# Patient Record
Sex: Female | Born: 1965
Health system: Southern US, Community
[De-identification: ages and names within clinical notes are randomized; demographics above are authoritative.]

## PROBLEM LIST (undated history)

## (undated) ENCOUNTER — Ambulatory Visit

## (undated) DIAGNOSIS — J45909 Unspecified asthma, uncomplicated: Secondary | ICD-10-CM

## (undated) DIAGNOSIS — G43909 Migraine, unspecified, not intractable, without status migrainosus: Secondary | ICD-10-CM

## (undated) HISTORY — PX: ADENOIDECTOMY: SUR15

## (undated) HISTORY — PX: DILATION AND CURETTAGE OF UTERUS: SHX78

## (undated) HISTORY — PX: TONSILLECTOMY: SUR1361

## (undated) HISTORY — DX: Unspecified asthma, uncomplicated: J45.909

---

## 1999-06-19 ENCOUNTER — Other Ambulatory Visit: Admission: RE | Admit: 1999-06-19 | Discharge: 1999-06-19 | Payer: Self-pay | Admitting: Obstetrics and Gynecology

## 1999-11-04 ENCOUNTER — Inpatient Hospital Stay (HOSPITAL_COMMUNITY): Admission: AD | Admit: 1999-11-04 | Discharge: 1999-11-04 | Payer: Self-pay | Admitting: Obstetrics & Gynecology

## 1999-11-05 ENCOUNTER — Ambulatory Visit (HOSPITAL_COMMUNITY): Admission: RE | Admit: 1999-11-05 | Discharge: 1999-11-05 | Payer: Self-pay | Admitting: Obstetrics and Gynecology

## 1999-11-08 ENCOUNTER — Encounter: Payer: Self-pay | Admitting: Obstetrics and Gynecology

## 1999-11-08 ENCOUNTER — Inpatient Hospital Stay (HOSPITAL_COMMUNITY): Admission: AD | Admit: 1999-11-08 | Discharge: 1999-11-08 | Payer: Self-pay | Admitting: Obstetrics and Gynecology

## 1999-11-10 ENCOUNTER — Inpatient Hospital Stay (HOSPITAL_COMMUNITY): Admission: AD | Admit: 1999-11-10 | Discharge: 1999-11-10 | Payer: Self-pay | Admitting: Obstetrics and Gynecology

## 1999-12-07 ENCOUNTER — Inpatient Hospital Stay (HOSPITAL_COMMUNITY): Admission: AD | Admit: 1999-12-07 | Discharge: 1999-12-07 | Payer: Self-pay | Admitting: Obstetrics and Gynecology

## 2000-01-07 ENCOUNTER — Inpatient Hospital Stay (HOSPITAL_COMMUNITY): Admission: AD | Admit: 2000-01-07 | Discharge: 2000-01-09 | Payer: Self-pay | Admitting: Obstetrics and Gynecology

## 2000-02-11 ENCOUNTER — Other Ambulatory Visit: Admission: RE | Admit: 2000-02-11 | Discharge: 2000-02-11 | Payer: Self-pay | Admitting: Obstetrics and Gynecology

## 2001-05-11 ENCOUNTER — Other Ambulatory Visit: Admission: RE | Admit: 2001-05-11 | Discharge: 2001-05-11 | Payer: Self-pay | Admitting: Obstetrics and Gynecology

## 2002-08-17 ENCOUNTER — Other Ambulatory Visit: Admission: RE | Admit: 2002-08-17 | Discharge: 2002-08-17 | Payer: Self-pay | Admitting: Obstetrics and Gynecology

## 2003-08-22 ENCOUNTER — Other Ambulatory Visit: Admission: RE | Admit: 2003-08-22 | Discharge: 2003-08-22 | Payer: Self-pay | Admitting: Obstetrics and Gynecology

## 2003-11-08 ENCOUNTER — Ambulatory Visit (HOSPITAL_COMMUNITY): Admission: RE | Admit: 2003-11-08 | Discharge: 2003-11-08 | Payer: Self-pay | Admitting: Obstetrics and Gynecology

## 2003-11-08 ENCOUNTER — Encounter (INDEPENDENT_AMBULATORY_CARE_PROVIDER_SITE_OTHER): Payer: Self-pay | Admitting: Specialist

## 2004-05-28 ENCOUNTER — Encounter: Admission: RE | Admit: 2004-05-28 | Discharge: 2004-07-10 | Payer: Self-pay | Admitting: Sports Medicine

## 2004-06-05 ENCOUNTER — Encounter: Admission: RE | Admit: 2004-06-05 | Discharge: 2004-06-05 | Payer: Self-pay | Admitting: Family Medicine

## 2004-09-03 ENCOUNTER — Other Ambulatory Visit: Admission: RE | Admit: 2004-09-03 | Discharge: 2004-09-03 | Payer: Self-pay | Admitting: Obstetrics and Gynecology

## 2005-09-10 ENCOUNTER — Other Ambulatory Visit: Admission: RE | Admit: 2005-09-10 | Discharge: 2005-09-10 | Payer: Self-pay | Admitting: Obstetrics and Gynecology

## 2006-09-15 ENCOUNTER — Other Ambulatory Visit: Admission: RE | Admit: 2006-09-15 | Discharge: 2006-09-15 | Payer: Self-pay | Admitting: Obstetrics and Gynecology

## 2006-09-28 ENCOUNTER — Encounter: Admission: RE | Admit: 2006-09-28 | Discharge: 2006-09-28 | Payer: Self-pay | Admitting: Obstetrics and Gynecology

## 2007-11-18 ENCOUNTER — Other Ambulatory Visit: Admission: RE | Admit: 2007-11-18 | Discharge: 2007-11-18 | Payer: Self-pay | Admitting: Obstetrics and Gynecology

## 2008-11-22 ENCOUNTER — Other Ambulatory Visit: Admission: RE | Admit: 2008-11-22 | Discharge: 2008-11-22 | Payer: Self-pay | Admitting: Obstetrics and Gynecology

## 2009-10-24 ENCOUNTER — Encounter: Admission: RE | Admit: 2009-10-24 | Discharge: 2009-10-24 | Payer: Self-pay | Admitting: Obstetrics and Gynecology

## 2009-12-14 ENCOUNTER — Other Ambulatory Visit: Admission: RE | Admit: 2009-12-14 | Discharge: 2009-12-14 | Payer: Self-pay | Admitting: Obstetrics and Gynecology

## 2010-02-21 ENCOUNTER — Ambulatory Visit (HOSPITAL_COMMUNITY): Admission: RE | Admit: 2010-02-21 | Discharge: 2010-02-21 | Payer: Self-pay | Admitting: Sports Medicine

## 2010-03-21 ENCOUNTER — Encounter: Admission: RE | Admit: 2010-03-21 | Discharge: 2010-05-30 | Payer: Self-pay | Admitting: Sports Medicine

## 2010-11-01 ENCOUNTER — Encounter
Admission: RE | Admit: 2010-11-01 | Discharge: 2010-11-01 | Payer: Self-pay | Source: Home / Self Care | Attending: Obstetrics and Gynecology | Admitting: Obstetrics and Gynecology

## 2010-12-20 ENCOUNTER — Other Ambulatory Visit (HOSPITAL_COMMUNITY)
Admission: RE | Admit: 2010-12-20 | Discharge: 2010-12-20 | Disposition: A | Discharge status: Home / Self Care | Payer: 59 | Source: Ambulatory Visit | Attending: Obstetrics and Gynecology | Admitting: Obstetrics and Gynecology

## 2010-12-20 ENCOUNTER — Other Ambulatory Visit: Payer: Self-pay | Admitting: Obstetrics and Gynecology

## 2010-12-20 DIAGNOSIS — Z01419 Encounter for gynecological examination (general) (routine) without abnormal findings: Secondary | ICD-10-CM | POA: Insufficient documentation

## 2011-03-14 NOTE — Op Note (Signed)
NAME:  Regina Rice, Regina Rice                          ACCOUNT NO.:  [id number]   MEDICAL RECORD NO.:  [id number]                   PATIENT TYPE:  AMB   LOCATION:  SDC                                  FACILITY:  [facility]   PHYSICIAN:  Artist Pais, M.D.                 DATE OF BIRTH:  1966-02-09   DATE OF PROCEDURE:  11/08/2003  DATE OF DISCHARGE:                                 OPERATIVE REPORT   PREOPERATIVE DIAGNOSES:  Endometrial polyp found on sonohysterogram.  Sonohysterogram performed due to thick endometrium on pelvic ultrasound.  The patient had the ultrasound for her menorrhagia.   POSTOPERATIVE DIAGNOSES:  1. Endometrial polyp found on sonohysterogram.  2. Menorrhagia.   SURGEON:  Artist Pais, MD, Ph.D.   PROCEDURE:  1. Dilatation and curettage.  2. Hysteroscopy.  3. Polypectomy.   ANESTHESIA:  Monitored anesthesia care plus 1% lidocaine, 20 mL paracervical  block.   ESTIMATED BLOOD LOSS:  Minimal.   FLUIDS:  1200 mL of crystalloid.   COMPLICATIONS:  None.   DRAINS:  None.   FINDINGS:  Thickened endometrium despite the patient being on Yasmin, which  she just started.  Three polyps were noted.  After curettage and  polypectomy, the scope was re-placed, and the polyps were noted to be  removed in their entirety.   DESCRIPTION OF OPERATION:  The patient was brought to the operating room,  identified on the operating room table.  After induction of adequate MAC  analgesia, the patient was placed in the dorsal lithotomy position and  prepped and draped in the usual sterile fashion.  The bladder was straight  catheterized for approximately 125 mL of clear yellow urine.  Examination  under anesthesia revealed the uterus to be anteverted, mobile, approximately  six weeks size.  A speculum was placed after sterile prep and drape, and the  anterior lip of the cervix was infiltrated with 1 mL of 1% lidocaine and  grasped with a single-tooth tenaculum.  The remaining 19 mL of  1% lidocaine  were placed for a paracervical block.  The cervix was then very gently  dilated up to a #25 Pratt dilator.  The uterus sounded to approximately 7  cm.  Dilatation proceeded very carefully and gently to decrease the risk of  uterine perforation.  Using the ACMI hysteroscope and sorbitol as the  distending medium, a careful and thorough hysteroscopic examination was  performed.  There were noted to be two large endometrial polyps and a  smaller endometrial polyp was noted behind one of the large endometrial  polyps.  The endometrium was noted to be extremely thickened and because of  the polyps and the thick endometrium, I was unable to identify the tubal  ostia.  Subsequently the scope was withdrawn, and a careful curettage was  performed with copious tissue and polyp obtained.  After a good cry was  heard all around, the [employer]  Stone forceps were placed, and additional  tissue was obtained in the endometrium.  Subsequently, the ACMI hysteroscope  was again placed, and a careful hysteroscopic examination revealed the  polyps to have been removed in their entirety.  In addition, the endometrium  was noted to be curetted completely.  At that point, the procedure was then  terminated.  The patient tolerated the procedure well without apparent  complication and was transferred to the recovery room in stable condition  after instrument, sponge, and needle counts were correct.  She was noted to  have minimal bleeding from the surgery but a small amount of bleeding from  the right-most tenaculum site, and excellent hemostasis was achieved using  Monsel solution.  She did receive the postop D&C instruction sheet, is urged  to call if she is soaking a large pad an hour for three straight hours or  for any problems.  She may take ibuprofen for pain.  She will return in two  weeks for postoperative examination and call with any problem.  She is to  refrain from intercourse as indicated  on the sheet for two weeks.                                               Artist Pais, M.D.    DC/MEDQ  D:  11/08/2003  T:  11/08/2003  Job:  [id number]

## 2011-03-14 NOTE — H&P (Signed)
NAME:  Regina Rice, Regina Rice                          ACCOUNT NO.:  [id number]   MEDICAL RECORD NO.:  [id number]                   PATIENT TYPE:  AMB   LOCATION:  SDC                                  [facility]ILITY:  [facility]   PHYSICIAN:  Artist Pais, M.D.                 DATE OF BIRTH:  October 25, 1966   DATE OF ADMISSION:  11/08/2003  DATE OF DISCHARGE:                                HISTORY & PHYSICAL   PREOPERATIVE HISTORY AND PHYSICAL:   HISTORY OF PRESENT ILLNESS:  The patient is a 45 year old Caucasian female  para 2 who was seen for her annual examination on August 22, 2003.  At that  time I was assessing how she was doing on her Yasmin oral contraceptive and  she is having predictable bleeding but was found to be using a Super Plus  Tampon and changing it every 2-3 hours on the second day.  She had  previously been placed on Yasmin to control her menorrhagia.  She  subsequently returned for a followup visit on September 06, 2003 after she  had undergone a pelvic ultrasound and this was for menorrhagia and a history  of fibroids.  Pelvic ultrasound revealed her endometrium to be thick at 8.9  mm with echogenic defects measuring 8 x 6 and 8 x 4 mm, thus the decision  was made for her to undergo a sonohistogram at the end of her menstrual  period.  She subsequently underwent a sonohistogram on September 29, 2003 at  which time she was found to have two posterior wall defects 8 x 6 mm and 7 x  6 mm consistent with polyps.  This may well be the etiology of her  menorrhagia.  She was advised to undergo a dilation and curettage  hysteroscopy and polypectomy.  Risks of surgery including anesthetic  complications, hemorrhage, infection, damage to adjacent structures  including bladder, bowel, blood vessels or ureter were discussed with the  patient.  She was made aware of the risks of uterine perforation which could  result in overwhelming life-threatening hemorrhage requiring emergent  hysterectomy or  uterine perforation which could result in bowel damage or  which could result in overwhelming life-threatening peritonitis.  If she  were to have bowel damage this might require a bowel resection or colostomy.  She expressed understanding of and acceptance of these risks and desires to  proceed with surgery.   PAST MEDICAL HISTORY:  1. Asthma.  2. Headaches.   ALLERGIES:  AMOXICILLIN and PENICILLIN.   CURRENT MEDICATIONS:  Yasmin.   SURGERIES:  1. T&A in 1985.  2. Spontaneous vaginal delivery x2 - one in 1997, one in March 2001.   FAMILY HISTORY:  There is no family history of colon, breast, or ovarian  cancer, a distant relative does have prostate cancer.  The patient's mother  is 78 with hypertension, arthritis, osteoporosis, and lupus.  Her father is  49  with heart disease and diabetes.  She has one brother age 33 alive and  well, two children are alive and well.   SOCIAL HISTORY:  Patient is a homemaker, she does not smoke and drinks  alcohol only occasionally.   REVIEW OF SYSTEMS:  Noncontributory except as noted above.  Denies headache,  visual changes, chest pain, shortness of breath, abdominal pain, change in  bowel habits, unintentional weight loss, dysuria, urgency, frequency,  vaginal pruritus or discharge, pain or bleeding with intercourse.   PHYSICAL EXAMINATION:  GENERAL APPEARANCE:  Well-developed Caucasian female.  VITAL SIGNS:  Blood pressure 116/80, heart rate 76, weight 226, height 5  feet 11-1/2 inches.  HEENT:  Normal.  NECK:  Supple without thyromegaly, adenopathy, or nodules.  CHEST:  Clear to auscultation.  BREASTS:  Symmetrical without masses, no nipple retraction or nipple  discharge.  CARDIAC:  Regular rate and rhythm without extra sounds or murmurs.  ABDOMEN:  Soft, nontender, no hepatosplenomegaly or masses.  PELVIC:  Normal external female genitalia, no vulvar, vaginal, or cervical  lesions.  Pap smear performed August 22, 2003 was within  normal limits.  Bimanual examination reveals the uterus to be normal, retroverted without  any adnexal mass palpated.  RECTAL:  Excellent sphincter tone, confirms pelvic exam, no masses palpated.   LABORATORY STUDIES:  Sodium is 138, potassium 4, chloride 103, CO2 27, BUN  is 11, creatinine 0.7 and glucose is elevated at 115 and we will check this  fasting.  Pregnancy test is negative.  Hemoglobin is 13.2 and platelet count  is 296,000.  Urinalysis is negative.   ASSESSMENT AND PLAN:  The patient is a 45 year old Caucasian female para 2  with menorrhagia and polyps found on sonohistogram admitted for dilation and  curettage hysteroscopy and polypectomy.  Risks of surgery have been  discussed with the patient, she expresses understanding of and acceptance of  those risks and desires to proceed with surgery.  All her questions were  answered.                                               Artist Pais, M.D.    DC/MEDQ  D:  11/07/2003  T:  11/07/2003  Job:  [id number]   cc:   [facility] at [facility]

## 2011-10-17 ENCOUNTER — Other Ambulatory Visit: Payer: Self-pay | Admitting: Obstetrics and Gynecology

## 2011-10-17 DIAGNOSIS — Z1231 Encounter for screening mammogram for malignant neoplasm of breast: Secondary | ICD-10-CM

## 2011-11-14 ENCOUNTER — Ambulatory Visit: Payer: 59

## 2011-12-26 ENCOUNTER — Ambulatory Visit
Admission: RE | Admit: 2011-12-26 | Discharge: 2011-12-26 | Disposition: A | Discharge status: Home / Self Care | Payer: 59 | Source: Ambulatory Visit | Attending: Obstetrics and Gynecology | Admitting: Obstetrics and Gynecology

## 2011-12-26 DIAGNOSIS — Z1231 Encounter for screening mammogram for malignant neoplasm of breast: Secondary | ICD-10-CM

## 2012-01-09 ENCOUNTER — Other Ambulatory Visit (HOSPITAL_COMMUNITY)
Admission: RE | Admit: 2012-01-09 | Discharge: 2012-01-09 | Disposition: A | Discharge status: Home / Self Care | Payer: 59 | Source: Ambulatory Visit | Attending: Obstetrics and Gynecology | Admitting: Obstetrics and Gynecology

## 2012-01-09 ENCOUNTER — Other Ambulatory Visit: Payer: Self-pay | Admitting: Obstetrics and Gynecology

## 2012-01-09 DIAGNOSIS — Z01419 Encounter for gynecological examination (general) (routine) without abnormal findings: Secondary | ICD-10-CM | POA: Insufficient documentation

## 2012-05-04 ENCOUNTER — Other Ambulatory Visit (HOSPITAL_COMMUNITY): Payer: Self-pay | Admitting: Family Medicine

## 2012-05-04 DIAGNOSIS — J329 Chronic sinusitis, unspecified: Secondary | ICD-10-CM

## 2012-05-05 ENCOUNTER — Ambulatory Visit (HOSPITAL_COMMUNITY)
Admission: RE | Admit: 2012-05-05 | Discharge: 2012-05-05 | Disposition: A | Discharge status: Home / Self Care | Payer: 59 | Source: Ambulatory Visit | Attending: Family Medicine | Admitting: Family Medicine

## 2012-05-05 DIAGNOSIS — J320 Chronic maxillary sinusitis: Secondary | ICD-10-CM | POA: Insufficient documentation

## 2012-05-05 DIAGNOSIS — J321 Chronic frontal sinusitis: Secondary | ICD-10-CM | POA: Insufficient documentation

## 2012-05-05 DIAGNOSIS — J329 Chronic sinusitis, unspecified: Secondary | ICD-10-CM

## 2012-05-05 DIAGNOSIS — J322 Chronic ethmoidal sinusitis: Secondary | ICD-10-CM | POA: Insufficient documentation

## 2012-05-05 DIAGNOSIS — R51 Headache: Secondary | ICD-10-CM | POA: Insufficient documentation

## 2012-09-01 ENCOUNTER — Emergency Department (INDEPENDENT_AMBULATORY_CARE_PROVIDER_SITE_OTHER)
Admission: EM | Admit: 2012-09-01 | Discharge: 2012-09-01 | Disposition: A | Discharge status: Home / Self Care | Payer: Self-pay | Source: Home / Self Care | Attending: Emergency Medicine | Admitting: Emergency Medicine

## 2012-09-01 ENCOUNTER — Encounter (HOSPITAL_COMMUNITY): Payer: Self-pay | Admitting: *Deleted

## 2012-09-01 ENCOUNTER — Emergency Department (INDEPENDENT_AMBULATORY_CARE_PROVIDER_SITE_OTHER): Payer: 59

## 2012-09-01 DIAGNOSIS — S139XXA Sprain of joints and ligaments of unspecified parts of neck, initial encounter: Secondary | ICD-10-CM

## 2012-09-01 DIAGNOSIS — S161XXA Strain of muscle, fascia and tendon at neck level, initial encounter: Secondary | ICD-10-CM

## 2012-09-01 DIAGNOSIS — S20219A Contusion of unspecified front wall of thorax, initial encounter: Secondary | ICD-10-CM

## 2012-09-01 HISTORY — DX: Migraine, unspecified, not intractable, without status migrainosus: G43.909

## 2012-09-01 MED ORDER — METHOCARBAMOL 500 MG PO TABS: 500.0000 mg | ORAL_TABLET | Freq: Three times a day (TID) | ORAL | Status: DC

## 2012-09-01 MED ORDER — TRAMADOL HCL 50 MG PO TABS: 100.0000 mg | ORAL_TABLET | Freq: Three times a day (TID) | ORAL | Status: DC | PRN
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
## 2012-12-11 ENCOUNTER — Other Ambulatory Visit: Payer: Self-pay

## 2012-12-24 ENCOUNTER — Other Ambulatory Visit: Payer: Self-pay

## 2012-12-24 DIAGNOSIS — Z1231 Encounter for screening mammogram for malignant neoplasm of breast: Secondary | ICD-10-CM

## 2013-01-13 ENCOUNTER — Other Ambulatory Visit: Payer: Self-pay | Admitting: Obstetrics and Gynecology

## 2013-01-13 ENCOUNTER — Other Ambulatory Visit (HOSPITAL_COMMUNITY)
Admission: RE | Admit: 2013-01-13 | Discharge: 2013-01-13 | Disposition: A | Discharge status: Home / Self Care | Payer: 59 | Source: Ambulatory Visit | Attending: Obstetrics and Gynecology | Admitting: Obstetrics and Gynecology

## 2013-01-13 DIAGNOSIS — Z01419 Encounter for gynecological examination (general) (routine) without abnormal findings: Secondary | ICD-10-CM | POA: Insufficient documentation

## 2013-01-13 DIAGNOSIS — Z1151 Encounter for screening for human papillomavirus (HPV): Secondary | ICD-10-CM | POA: Insufficient documentation

## 2013-01-27 ENCOUNTER — Other Ambulatory Visit: Payer: Self-pay | Admitting: Obstetrics and Gynecology

## 2013-01-28 ENCOUNTER — Ambulatory Visit
Admission: RE | Admit: 2013-01-28 | Discharge: 2013-01-28 | Disposition: A | Discharge status: Home / Self Care | Payer: 59 | Source: Ambulatory Visit

## 2013-01-28 DIAGNOSIS — Z1231 Encounter for screening mammogram for malignant neoplasm of breast: Secondary | ICD-10-CM

## 2013-01-31 ENCOUNTER — Other Ambulatory Visit: Payer: Self-pay | Admitting: Obstetrics and Gynecology

## 2013-01-31 DIAGNOSIS — R928 Other abnormal and inconclusive findings on diagnostic imaging of breast: Secondary | ICD-10-CM

## 2013-02-09 ENCOUNTER — Ambulatory Visit
Admission: RE | Admit: 2013-02-09 | Discharge: 2013-02-09 | Disposition: A | Discharge status: Home / Self Care | Payer: 59 | Source: Ambulatory Visit | Attending: Obstetrics and Gynecology | Admitting: Obstetrics and Gynecology

## 2013-02-09 DIAGNOSIS — R928 Other abnormal and inconclusive findings on diagnostic imaging of breast: Secondary | ICD-10-CM

## 2013-09-01 ENCOUNTER — Other Ambulatory Visit: Payer: Self-pay

## 2014-02-03 ENCOUNTER — Other Ambulatory Visit (HOSPITAL_COMMUNITY)
Admission: RE | Admit: 2014-02-03 | Discharge: 2014-02-03 | Disposition: A | Discharge status: Home / Self Care | Payer: 59 | Source: Ambulatory Visit | Attending: Obstetrics and Gynecology | Admitting: Obstetrics and Gynecology

## 2014-02-03 ENCOUNTER — Other Ambulatory Visit: Payer: Self-pay | Admitting: Obstetrics and Gynecology

## 2014-02-03 DIAGNOSIS — Z01419 Encounter for gynecological examination (general) (routine) without abnormal findings: Secondary | ICD-10-CM | POA: Insufficient documentation

## 2014-02-20 ENCOUNTER — Other Ambulatory Visit: Payer: Self-pay | Admitting: Obstetrics and Gynecology

## 2014-03-24 ENCOUNTER — Other Ambulatory Visit (HOSPITAL_COMMUNITY): Payer: Self-pay | Admitting: Family Medicine

## 2014-03-24 DIAGNOSIS — R110 Nausea: Secondary | ICD-10-CM

## 2014-03-24 DIAGNOSIS — R1110 Vomiting, unspecified: Secondary | ICD-10-CM

## 2014-03-29 ENCOUNTER — Ambulatory Visit (HOSPITAL_COMMUNITY)
Admission: RE | Admit: 2014-03-29 | Discharge: 2014-03-29 | Disposition: A | Discharge status: Home / Self Care | Payer: 59 | Source: Ambulatory Visit | Attending: Family Medicine | Admitting: Family Medicine

## 2014-03-29 DIAGNOSIS — R1110 Vomiting, unspecified: Secondary | ICD-10-CM

## 2014-03-29 DIAGNOSIS — R110 Nausea: Secondary | ICD-10-CM

## 2014-03-29 DIAGNOSIS — R112 Nausea with vomiting, unspecified: Secondary | ICD-10-CM | POA: Insufficient documentation

## 2014-03-29 DIAGNOSIS — K8020 Calculus of gallbladder without cholecystitis without obstruction: Secondary | ICD-10-CM | POA: Insufficient documentation

## 2014-05-23 ENCOUNTER — Other Ambulatory Visit: Payer: Self-pay | Admitting: Obstetrics and Gynecology

## 2014-06-07 ENCOUNTER — Encounter (HOSPITAL_COMMUNITY): Admission: RE | Payer: Self-pay | Source: Ambulatory Visit

## 2014-06-07 ENCOUNTER — Ambulatory Visit (HOSPITAL_COMMUNITY): Admission: RE | Admit: 2014-06-07 | Payer: 59 | Source: Ambulatory Visit | Admitting: Obstetrics and Gynecology

## 2014-06-07 SURGERY — HYSTERECTOMY, VAGINAL, LAPAROSCOPY-ASSISTED
Anesthesia: Choice

## 2014-12-12 ENCOUNTER — Other Ambulatory Visit: Payer: Self-pay

## 2014-12-12 DIAGNOSIS — Z1231 Encounter for screening mammogram for malignant neoplasm of breast: Secondary | ICD-10-CM

## 2014-12-22 ENCOUNTER — Ambulatory Visit
Admission: RE | Admit: 2014-12-22 | Discharge: 2014-12-22 | Disposition: A | Discharge status: Home / Self Care | Payer: 59 | Source: Ambulatory Visit

## 2014-12-22 ENCOUNTER — Other Ambulatory Visit: Payer: Self-pay

## 2014-12-22 DIAGNOSIS — Z1231 Encounter for screening mammogram for malignant neoplasm of breast: Secondary | ICD-10-CM

## 2015-03-23 ENCOUNTER — Other Ambulatory Visit (HOSPITAL_COMMUNITY)
Admission: RE | Admit: 2015-03-23 | Discharge: 2015-03-23 | Disposition: A | Discharge status: Home / Self Care | Payer: 59 | Source: Ambulatory Visit | Attending: Obstetrics and Gynecology | Admitting: Obstetrics and Gynecology

## 2015-03-23 ENCOUNTER — Other Ambulatory Visit: Payer: Self-pay | Admitting: Obstetrics and Gynecology

## 2015-03-23 DIAGNOSIS — Z01419 Encounter for gynecological examination (general) (routine) without abnormal findings: Secondary | ICD-10-CM | POA: Diagnosis present

## 2015-03-27 LAB — CYTOLOGY - PAP

## 2015-08-14 ENCOUNTER — Other Ambulatory Visit: Payer: Self-pay | Admitting: *Deleted

## 2015-08-14 MED ORDER — MOMETASONE FUROATE 50 MCG/ACT NA SUSP: 2.0000 | Freq: Every day | NASAL | Status: DC

## 2015-10-01 ENCOUNTER — Other Ambulatory Visit: Payer: Self-pay | Admitting: Neurology

## 2015-10-01 MED ORDER — MONTELUKAST SODIUM 10 MG PO TABS: 10.0000 mg | ORAL_TABLET | Freq: Every day | ORAL | Status: DC

## 2015-11-05 ENCOUNTER — Other Ambulatory Visit: Payer: Self-pay | Admitting: Allergy and Immunology

## 2015-11-23 ENCOUNTER — Ambulatory Visit (INDEPENDENT_AMBULATORY_CARE_PROVIDER_SITE_OTHER): Payer: 59 | Admitting: Allergy and Immunology

## 2015-11-23 VITALS — BP 116/82 | HR 76 | Resp 16

## 2015-11-23 DIAGNOSIS — R059 Cough, unspecified: Secondary | ICD-10-CM

## 2015-11-23 DIAGNOSIS — R062 Wheezing: Secondary | ICD-10-CM | POA: Diagnosis not present

## 2015-11-23 DIAGNOSIS — H1010 Acute atopic conjunctivitis, unspecified eye: Secondary | ICD-10-CM

## 2015-11-23 DIAGNOSIS — J309 Allergic rhinitis, unspecified: Secondary | ICD-10-CM

## 2015-11-23 DIAGNOSIS — R05 Cough: Secondary | ICD-10-CM

## 2015-11-23 MED ORDER — MONTELUKAST SODIUM 10 MG PO TABS: ORAL_TABLET | ORAL | Status: DC

## 2015-11-23 MED FILL — MONTELUKAST SOD 10 MG TAB: 10 | 30 days supply | Qty: 30 | Fill #0

## 2015-11-23 MED FILL — MOMETASONE FUROATE 50 MCG S: 50 | 30 days supply | Qty: 17 | Fill #2

## 2015-11-23 NOTE — Patient Instructions (Addendum)
  Use Astepro 2 sprays twice daily.  Continue Nasonex each morning  Restart Singulair each evening.  For duration of sample use QVAR 51mcg 3 puffs once daily--rinse, gargle and spit with water after use.  ProAir Respiclick 2 puffs every 4 hours as needed for cough or wheeze.  Saline nasal wash prior to medicated sprays-- and each evening at shower time.  Follow-up 6-9 months or sooner if needed.

## 2015-11-23 NOTE — Progress Notes (Signed)
     FOLLOW UP NOTE  RE: Regina Rice MRN: [id number] DOB: 05-24-1966 [facility] [address] [place] [id number] Date of Office Visit: 11/23/2015  Subjective:  Regina Rice is a 50 y.o. female who presents today for Medication Refill  Assessment:   1. Cough and previous history of wheeze, probable component of mild asthma, now in her greater symptomatic season.  2.      Allergic rhinoconjunctivitis. 3.      Previous history of recurring clinical sinusitis, improved with preventative upper airway management. Plan:   Meds ordered this encounter  Medications  . montelukast (SINGULAIR) 10 MG tablet    Sig: Take one tablet each evening to prevent cough or wheeze.    Dispense:  30 tablet    Refill:  5   Patient Instructions  1.  Use Astepro 2 sprays twice daily, until well, then decrease to once daily. 2.  Continue Nasonex each morning 3.  Restart Singulair each evening. 4.  For duration of sample use QVAR 35mcg 3 puffs once daily--rinse, gargle and spit with water after use. 5.  ProAir Respiclick 2 puffs every 4 hours as needed for cough or wheeze. 6.  Saline nasal wash prior to medicated sprays-- and each evening at shower time. 7.  Follow-up 6-9 months or sooner if needed.  HPI: Regina Rice returns to the office requesting refill medications.  She is recently out of Singulair, approximately 2 weeks.  It has been a year since her last visit as she did not follow up noting how well she was doing.  She describes maintaining on her usual regime but with fluctuant weather patterns and significant temperature changes, she has noticed congestion, postnasal drip and cough.  She denies fever, sore throat, headache, discolored drainage, difficulty breathing or shortness of breath.  She is using Mucinex recently, but no recent albuterol to use.  She finds the nasal sprays helpful and also reports completing a course of antibiotics in December for a sinus  infection.  There may be rare chest congestion in the morning, not persisting.  No other new medical issues and otherwise is very pleased. Denies ED or urgent care visits, prednisone or other antibiotic courses. Reports sleep and activity are normal.  Regina Rice has a current medication list which includes the following prescription(s): aspirin, azelastine hcl, dextromethorphan-guaifenesin, glucosamine-chondroitin, guaifenesin, mometasone, montelukast, multiple vitamin, omeprazole magnesium , and tri-previfem.   Drug Allergies: Allergies  Allergen Reactions  . Amoxicillin   . Septra [Sulfamethoxazole-Trimethoprim]    Objective:   Filed Vitals:   11/23/15 1414  BP: 116/82  Pulse: 76  Resp: 16   Physical Exam  Constitutional: She is well-developed, well-nourished, and in no distress.  HENT:  Head: Atraumatic.  Right Ear: Tympanic membrane and ear canal normal.  Left Ear: Tympanic membrane and ear canal normal.  Nose: Mucosal edema present. No rhinorrhea. No epistaxis.  Mouth/Throat: Oropharynx is clear and moist and mucous membranes are normal. No oropharyngeal exudate, posterior oropharyngeal edema or posterior oropharyngeal erythema.  Neck: Neck supple.  Cardiovascular: Normal rate, S1 normal and S2 normal.   No murmur heard. Pulmonary/Chest: Effort normal. She has no wheezes. She has no rhonchi. She has no rales.  Lymphadenopathy:    She has no cervical adenopathy.   Diagnostics: Spirometry:  FVC 3.94--101%, FEV1 3.45 110%.    Bebe Moncure M. Ishmael Holter, MD  cc: Simona Huh, MD

## 2015-11-27 ENCOUNTER — Other Ambulatory Visit: Payer: Self-pay | Admitting: *Deleted

## 2015-11-27 MED ORDER — ALBUTEROL SULFATE 108 (90 BASE) MCG/ACT IN AEPB: 2.0000 | INHALATION_SPRAY | Freq: Four times a day (QID) | RESPIRATORY_TRACT | Status: DC | PRN

## 2015-11-27 MED FILL — PROAIR RESPICLICK INHAL PWD: 108 (90 BAS | 25 days supply | Qty: 1 | Fill #0

## 2015-11-27 NOTE — Telephone Encounter (Signed)
Patient called stating ProAir Respiclick was not sent to pharmacy. Sent Proair to [facility] outpatient.

## 2015-12-06 ENCOUNTER — Other Ambulatory Visit: Payer: Self-pay | Admitting: Physician Assistant

## 2015-12-06 ENCOUNTER — Ambulatory Visit
Admission: RE | Admit: 2015-12-06 | Discharge: 2015-12-06 | Disposition: A | Discharge status: Home / Self Care | Payer: 59 | Source: Ambulatory Visit | Attending: Physician Assistant | Admitting: Physician Assistant

## 2015-12-06 DIAGNOSIS — R059 Cough, unspecified: Secondary | ICD-10-CM

## 2015-12-06 DIAGNOSIS — R0602 Shortness of breath: Secondary | ICD-10-CM

## 2015-12-06 DIAGNOSIS — R05 Cough: Secondary | ICD-10-CM

## 2015-12-06 DIAGNOSIS — R509 Fever, unspecified: Secondary | ICD-10-CM | POA: Diagnosis not present

## 2015-12-06 MED FILL — AZITHROMYCIN 250 MG TABLET: 250 | 5 days supply | Qty: 6 | Fill #0

## 2015-12-06 MED FILL — predniSONE 20 MG TABS: 20 | 9 days supply | Qty: 18 | Fill #0

## 2015-12-10 MED FILL — BENZONATATE 200 MG CAPSULE: 200 | 10 days supply | Qty: 30 | Fill #0

## 2015-12-14 MED FILL — HYDROCODONE-HOMATROPINE SYR: 5-1.5 | 6 days supply | Qty: 120 | Fill #0

## 2015-12-19 ENCOUNTER — Other Ambulatory Visit: Payer: Self-pay

## 2015-12-19 MED ORDER — AZELASTINE HCL 0.15 % NA SOLN: 2.0000 | Freq: Every day | NASAL | Status: DC

## 2015-12-19 MED FILL — MONTELUKAST SOD 10 MG TAB: 10 | 30 days supply | Qty: 30 | Fill #1

## 2015-12-19 MED FILL — AZELASTINE 0.15% NASAL SPRY: 0.15 | 50 days supply | Qty: 30 | Fill #0

## 2015-12-20 DIAGNOSIS — J189 Pneumonia, unspecified organism: Secondary | ICD-10-CM | POA: Diagnosis not present

## 2016-01-02 ENCOUNTER — Other Ambulatory Visit: Payer: Self-pay | Admitting: Physician Assistant

## 2016-01-02 ENCOUNTER — Ambulatory Visit
Admission: RE | Admit: 2016-01-02 | Discharge: 2016-01-02 | Disposition: A | Discharge status: Home / Self Care | Payer: 59 | Source: Ambulatory Visit | Attending: Physician Assistant | Admitting: Physician Assistant

## 2016-01-02 DIAGNOSIS — J189 Pneumonia, unspecified organism: Secondary | ICD-10-CM

## 2016-01-02 DIAGNOSIS — R05 Cough: Secondary | ICD-10-CM | POA: Diagnosis not present

## 2016-01-17 MED FILL — MOMETASONE FUROATE 50 MCG S: 50 | 30 days supply | Qty: 17 | Fill #3

## 2016-01-17 MED FILL — TRI-PREVIFEM TABLET: 0.18/0.215/ | 84 days supply | Qty: 84 | Fill #2

## 2016-01-21 MED FILL — MONTELUKAST SOD 10 MG TAB: 10 | 30 days supply | Qty: 30 | Fill #2

## 2016-02-18 ENCOUNTER — Other Ambulatory Visit: Payer: Self-pay

## 2016-02-18 ENCOUNTER — Other Ambulatory Visit: Payer: Self-pay | Admitting: Allergy and Immunology

## 2016-02-18 DIAGNOSIS — Z1231 Encounter for screening mammogram for malignant neoplasm of breast: Secondary | ICD-10-CM

## 2016-02-18 MED FILL — MOMETASONE FUROATE 50 MCG S: 50 | 30 days supply | Qty: 17 | Fill #0

## 2016-02-20 MED FILL — MONTELUKAST SOD 10 MG TAB: 10 | 30 days supply | Qty: 30 | Fill #3

## 2016-02-29 ENCOUNTER — Ambulatory Visit
Admission: RE | Admit: 2016-02-29 | Discharge: 2016-02-29 | Disposition: A | Discharge status: Home / Self Care | Payer: 59 | Source: Ambulatory Visit

## 2016-02-29 DIAGNOSIS — Z1231 Encounter for screening mammogram for malignant neoplasm of breast: Secondary | ICD-10-CM | POA: Diagnosis not present

## 2016-03-19 MED FILL — MONTELUKAST SOD 10 MG TAB: 10 | 30 days supply | Qty: 30 | Fill #4

## 2016-03-25 ENCOUNTER — Other Ambulatory Visit: Payer: Self-pay | Admitting: Obstetrics and Gynecology

## 2016-03-25 ENCOUNTER — Other Ambulatory Visit (HOSPITAL_COMMUNITY)
Admission: RE | Admit: 2016-03-25 | Discharge: 2016-03-25 | Disposition: A | Discharge status: Home / Self Care | Payer: 59 | Source: Ambulatory Visit | Attending: Obstetrics and Gynecology | Admitting: Obstetrics and Gynecology

## 2016-03-25 DIAGNOSIS — Z01419 Encounter for gynecological examination (general) (routine) without abnormal findings: Secondary | ICD-10-CM | POA: Insufficient documentation

## 2016-03-25 DIAGNOSIS — Z1151 Encounter for screening for human papillomavirus (HPV): Secondary | ICD-10-CM | POA: Diagnosis not present

## 2016-03-25 DIAGNOSIS — Z3041 Encounter for surveillance of contraceptive pills: Secondary | ICD-10-CM | POA: Diagnosis not present

## 2016-03-27 LAB — CYTOLOGY - PAP

## 2016-04-01 DIAGNOSIS — H5213 Myopia, bilateral: Secondary | ICD-10-CM | POA: Diagnosis not present

## 2016-04-01 DIAGNOSIS — H524 Presbyopia: Secondary | ICD-10-CM | POA: Diagnosis not present

## 2016-04-04 MED FILL — TRI-PREVIFEM TABLET: 0.18/0.215/ | 84 days supply | Qty: 84 | Fill #0

## 2016-04-07 MED FILL — MOMETASONE FUROATE 50 MCG S: 50 | 30 days supply | Qty: 17 | Fill #1

## 2016-04-18 MED FILL — MONTELUKAST SOD 10 MG TAB: 10 | 30 days supply | Qty: 30 | Fill #5

## 2016-05-29 ENCOUNTER — Other Ambulatory Visit: Payer: Self-pay | Admitting: Allergy and Immunology

## 2016-05-29 MED FILL — AZITHROMYCIN 250 MG TABLET: 250 | 5 days supply | Qty: 6 | Fill #0

## 2016-05-29 MED FILL — MOMETASONE FUROATE 50 MCG S: 50 | 30 days supply | Qty: 17 | Fill #2

## 2016-05-29 MED FILL — IBUPROFEN 600 MG TABLET: 600 | 5 days supply | Qty: 20 | Fill #0

## 2016-05-29 MED FILL — MONTELUKAST SOD 10 MG TAB: 10 | 30 days supply | Qty: 30 | Fill #0

## 2016-06-03 MED FILL — HYDROCODON-APAP 7.5-325: 7.5-325 | 1 days supply | Qty: 5 | Fill #0

## 2016-06-03 MED FILL — AZITHROMYCIN 250 MG TABLET: 250 | 5 days supply | Qty: 6 | Fill #0

## 2016-07-03 MED FILL — TRI-PREVIFEM TABLET: 0.18/0.215/ | 84 days supply | Qty: 84 | Fill #1

## 2016-07-03 MED FILL — MONTELUKAST SOD 10 MG TAB: 10 | 30 days supply | Qty: 30 | Fill #1

## 2016-07-03 MED FILL — MOMETASONE FUROATE 50 MCG S: 50 | 30 days supply | Qty: 17 | Fill #3

## 2016-08-14 ENCOUNTER — Other Ambulatory Visit: Payer: Self-pay | Admitting: Allergy and Immunology

## 2016-08-14 MED FILL — MONTELUKAST SOD 10 MG TAB: 10 | 30 days supply | Qty: 30 | Fill #2

## 2016-08-19 ENCOUNTER — Other Ambulatory Visit: Payer: Self-pay | Admitting: Allergy and Immunology

## 2016-08-19 MED ORDER — MOMETASONE FUROATE 50 MCG/ACT NA SUSP: NASAL | 0 refills | Status: DC

## 2016-08-19 NOTE — Telephone Encounter (Signed)
Regina Rice is a Dr. Ishmael Holter patient and last saw her on 11/23/15. She has made an appointment with Dr. Nelva Bush on 08/25/16. She needs a refill on her Nasonex. Pharmacy is [facility].

## 2016-08-19 NOTE — Telephone Encounter (Signed)
Script sent into [facility].

## 2016-08-20 MED FILL — MOMETASONE FUROATE 50 MCG S: 50 | 30 days supply | Qty: 17 | Fill #0

## 2016-08-25 ENCOUNTER — Encounter: Payer: Self-pay | Admitting: Allergy

## 2016-08-25 ENCOUNTER — Ambulatory Visit (INDEPENDENT_AMBULATORY_CARE_PROVIDER_SITE_OTHER): Payer: 59 | Admitting: Allergy

## 2016-08-25 VITALS — BP 120/78 | HR 71 | Temp 98.2°F | Resp 18

## 2016-08-25 DIAGNOSIS — J4530 Mild persistent asthma, uncomplicated: Secondary | ICD-10-CM | POA: Insufficient documentation

## 2016-08-25 DIAGNOSIS — H1010 Acute atopic conjunctivitis, unspecified eye: Secondary | ICD-10-CM | POA: Diagnosis not present

## 2016-08-25 DIAGNOSIS — J309 Allergic rhinitis, unspecified: Secondary | ICD-10-CM

## 2016-08-25 MED ORDER — BECLOMETHASONE DIPROPIONATE 80 MCG/ACT IN AERS
INHALATION_SPRAY | RESPIRATORY_TRACT | 5 refills | Status: DC
Start: 2016-08-25 — End: 2016-08-26

## 2016-08-25 MED ORDER — ALBUTEROL SULFATE 108 (90 BASE) MCG/ACT IN AEPB
2.0000 | INHALATION_SPRAY | Freq: Four times a day (QID) | RESPIRATORY_TRACT | 2 refills | Status: DC | PRN
Start: 2016-08-25 — End: 2017-12-02

## 2016-08-25 NOTE — Patient Instructions (Signed)
  Use Astepro 2 sprays twice daily.  Continue Nasonex 2 sprays each morning  Continue Singulair each evening.  Use Qvar 80 mcg 3 puffs three times a day during asthma flares  ProAir Respiclick 2 puffs every 4 hours as needed for cough or wheeze.  Saline nasal wash prior to medicated sprays-- and each evening at shower time.  Follow-up 6 months or sooner if needed.

## 2016-08-25 NOTE — Progress Notes (Signed)
Follow-up Note  RE: KHRISTY ALLAIN MRN: [id number] DOB: Nov 20, 1965 Date of Office Visit: 08/25/2016   History of present illness: VIDHYA HUSCHER is a 50 y.o. female presenting today for follow-up of she was last seen in our office in January 2017 by Dr. Ishmael Holter. She has been followed with [place] for mild asthma, allergic rhinoconjunctivitis and previous history of recurrent sinusitis.    She feels she has been doing well since last visit.   She can tell with the changes in the weather that her allergy symptoms have worsened with nasal congestion and some hoarseness.  She takes singulair and allegra takes year round.  Uses nasonex in the AM and astepro in Pm.   She also has albuterol respiclick that she uses as needed.  She has only needed to use a few occasions over the past several times.   She otherwise does not have an ICS.  She has not had any flareups requiring ED or urgent care visits or oral steroids or hospitalizations. She does not believe she is required any antibiotics for recurrent sinusitis but if she dishes in the spring.     Review of systems: Review of Systems  Constitutional: Negative for chills and fever.  HENT: Positive for congestion. Negative for sore throat.   Eyes: Negative for redness.  Respiratory: Negative for cough, shortness of breath and wheezing.   Cardiovascular: Negative for chest pain.  Gastrointestinal: Negative for nausea and vomiting.  Skin: Negative for itching and rash.  Neurological: Negative for headaches.    All other systems negative unless noted above in HPI  Past medical/social/surgical/family history have been reviewed and are unchanged unless specifically indicated below.  No changes  Medication List:   Medication List       Accurate as of 08/25/16  5:57 PM. Always use your most recent med list.          Albuterol Sulfate 108 (90 Base) MCG/ACT Aepb Commonly known as:  PROAIR RESPICLICK Inhale 2 puffs into the lungs every 6 (six)  hours as needed.   aspirin 81 MG tablet Take 81 mg by mouth daily.   Azelastine HCl 0.15 % Soln Commonly known as:  ASTEPRO Place 2 sprays into the nose daily.   glucosamine-chondroitin 500-400 MG tablet Take 1 tablet by mouth 2 (two) times daily.   mometasone 50 MCG/ACT nasal spray Commonly known as:  NASONEX PLACE 2 SPRAYS INTO THE NOSE DAILY.   montelukast 10 MG tablet Commonly known as:  SINGULAIR TAKE ONE TABLET EACH EVENING TO PREVENT COUGH OR WHEEZE.   MULTI-VITAMIN PO Take by mouth.   Omeprazole Magnesium 20.6 (20 Base) MG Cpdr Take 1 tablet by mouth.   TRI-PREVIFEM 0.18/0.215/0.25 MG-35 MCG tablet Generic drug:  Norgestimate-Ethinyl Estradiol Triphasic       Known medication allergies: Allergies  Allergen Reactions  . Septra [Sulfamethoxazole-Trimethoprim] Swelling  . Amoxicillin Rash     Physical examination: Blood pressure 120/78, pulse 71, temperature 98.2 F (36.8 C), temperature source Oral, resp. rate 18, SpO2 97 %.  General: Alert, interactive, in no acute distress. HEENT: TMs pearly gray, turbinates mildly edematous with clear discharge, post-pharynx non erythematous. Neck: Supple without lymphadenopathy. Lungs: Clear to auscultation without wheezing, rhonchi or rales. {no increased work of breathing. CV: Normal S1, S2 without murmurs. Abdomen: Nondistended, nontender. Skin: Warm and dry, without lesions or rashes. Extremities:  No clubbing, cyanosis or edema. Neuro:   Grossly intact.  Diagnositics/Labs:  Spirometry: FEV1: 3.44L  110%, FVC: 4.01L  104%, ratio consistent with Nonobstructive pattern  Assessment and plan:   Mild persistent asthma - Well-controlled - Continue Singulair each evening - Use Qvar 80 mcg 3 puffs three times a day during asthma flares - ProAir Respiclick 2 puffs every 4 hours as needed for cough or wheeze.  Allergic rhinoconjunctivitis - Use Astepro 2 sprays twice daily. - Continue Nasonex 2 sprays each  morning - Continue Allegra 180 mg daily - Saline nasal wash prior to medicated sprays-- and each evening at shower time.  Follow-up 6 months or sooner if needed.  I appreciate the opportunity to take part in Camiyah's care. Please do not hesitate to contact me with questions.  Sincerely,   Prudy Feeler, MD Allergy/Immunology [facility] [place] [place]

## 2016-08-26 ENCOUNTER — Telehealth: Payer: Self-pay | Admitting: Allergy

## 2016-08-26 MED ORDER — BECLOMETHASONE DIPROPIONATE 80 MCG/ACT IN AERS: 2.0000 | INHALATION_SPRAY | Freq: Two times a day (BID) | RESPIRATORY_TRACT | 5 refills | Status: DC

## 2016-08-26 MED FILL — QVAR 80 MCG ORAL INHALER: 80 | 30 days supply | Qty: 9 | Fill #0

## 2016-08-26 MED FILL — PROAIR RESPICLICK INHAL PWD: 108 (90 BAS | 25 days supply | Qty: 1 | Fill #0

## 2016-08-26 NOTE — Telephone Encounter (Signed)
Ask for Manuela Schwartz. She needs to verify the directions on the QVar 80.

## 2016-08-26 NOTE — Telephone Encounter (Signed)
Qvar 80 sent to pharmacy two puffs twice daily. Pt was advised to do 3 puffs TID for asthma flares.

## 2016-09-12 ENCOUNTER — Other Ambulatory Visit: Payer: Self-pay | Admitting: Allergy

## 2016-09-12 MED FILL — MONTELUKAST SOD 10 MG TAB: 10 | 30 days supply | Qty: 30 | Fill #3

## 2016-09-12 MED FILL — MOMETASONE FUROATE 50 MCG S: 50 | 30 days supply | Qty: 17 | Fill #0

## 2016-09-29 MED FILL — TRI-PREVIFEM TABLET: 0.18/0.215/ | 84 days supply | Qty: 84 | Fill #2

## 2016-10-10 MED FILL — MONTELUKAST SOD 10 MG TAB: 10 | 30 days supply | Qty: 30 | Fill #4

## 2016-10-10 MED FILL — AZELASTINE 0.15% NASAL SPRY: 0.15 | 50 days supply | Qty: 30 | Fill #1

## 2016-10-10 MED FILL — MOMETASONE FUROATE 50 MCG S: 50 | 30 days supply | Qty: 17 | Fill #1

## 2016-11-24 MED FILL — MONTELUKAST SOD 10 MG TAB: 10 | 30 days supply | Qty: 30 | Fill #5

## 2016-11-24 MED FILL — QVAR 80 MCG ORAL INHALER: 80 | 30 days supply | Qty: 9 | Fill #1

## 2016-11-24 MED FILL — MOMETASONE FUROATE 50 MCG S: 50 | 30 days supply | Qty: 17 | Fill #2

## 2016-11-28 DIAGNOSIS — M25572 Pain in left ankle and joints of left foot: Secondary | ICD-10-CM | POA: Diagnosis not present

## 2016-11-28 MED FILL — predniSONE 10 MG TABS: 10 | 9 days supply | Qty: 18 | Fill #0

## 2016-12-12 DIAGNOSIS — R0602 Shortness of breath: Secondary | ICD-10-CM | POA: Diagnosis not present

## 2016-12-12 DIAGNOSIS — M25572 Pain in left ankle and joints of left foot: Secondary | ICD-10-CM | POA: Diagnosis not present

## 2016-12-12 DIAGNOSIS — R05 Cough: Secondary | ICD-10-CM | POA: Diagnosis not present

## 2016-12-12 MED FILL — BENZONATATE 200 MG CAPSULE: 200 | 10 days supply | Qty: 30 | Fill #0

## 2016-12-12 MED FILL — PROMETHAZINE-CODEINE SYRUP: 6.25-10 | 6 days supply | Qty: 120 | Fill #0

## 2016-12-15 MED FILL — TRI-PREVIFEM TABLET: 0.18/0.215/ | 84 days supply | Qty: 84 | Fill #3

## 2016-12-19 ENCOUNTER — Other Ambulatory Visit: Payer: Self-pay | Admitting: Allergy

## 2016-12-19 DIAGNOSIS — J329 Chronic sinusitis, unspecified: Secondary | ICD-10-CM | POA: Diagnosis not present

## 2016-12-19 DIAGNOSIS — R062 Wheezing: Secondary | ICD-10-CM | POA: Diagnosis not present

## 2016-12-19 MED ORDER — MONTELUKAST SODIUM 10 MG PO TABS: 10.0000 mg | ORAL_TABLET | Freq: Every day | ORAL | 1 refills | Status: DC

## 2016-12-19 MED FILL — CLARITHROMYCIN 500 MG TAB: 500 | 10 days supply | Qty: 20 | Fill #0

## 2016-12-19 MED FILL — predniSONE 20 MG TABS: 20 | 6 days supply | Qty: 12 | Fill #0

## 2016-12-19 MED FILL — MONTELUKAST SOD 10 MG TAB: 10 | 30 days supply | Qty: 30 | Fill #0

## 2016-12-19 NOTE — Telephone Encounter (Signed)
Script sent into pharmacy 

## 2016-12-19 NOTE — Telephone Encounter (Signed)
Patient needs refill on singulair called in to [facility] on [address]. Patient states that the pharmacy sent a request over earlier in the week - but may be awaiting a prior auth.

## 2016-12-26 MED FILL — MOMETASONE FUROATE 50 MCG S: 50 | 30 days supply | Qty: 17 | Fill #3

## 2016-12-26 MED FILL — QVAR 80 MCG ORAL INHALER: 80 | 30 days supply | Qty: 9 | Fill #2

## 2017-01-26 ENCOUNTER — Other Ambulatory Visit: Payer: Self-pay | Admitting: Allergy

## 2017-01-26 MED FILL — MOMETASONE FUROATE 50 MCG S: 50 | 30 days supply | Qty: 17 | Fill #0

## 2017-01-26 MED FILL — MONTELUKAST SOD 10 MG TAB: 10 | 30 days supply | Qty: 30 | Fill #1

## 2017-02-16 ENCOUNTER — Other Ambulatory Visit: Payer: Self-pay | Admitting: Allergy and Immunology

## 2017-02-16 ENCOUNTER — Other Ambulatory Visit: Payer: Self-pay | Admitting: Allergy

## 2017-02-16 MED ORDER — AZELASTINE HCL 0.15 % NA SOLN: 2.0000 | Freq: Every day | NASAL | 5 refills | Status: DC

## 2017-02-19 MED FILL — MONTELUKAST SOD 10 MG TAB: 10 | 30 days supply | Qty: 30 | Fill #0

## 2017-02-23 ENCOUNTER — Ambulatory Visit (INDEPENDENT_AMBULATORY_CARE_PROVIDER_SITE_OTHER): Payer: 59 | Admitting: Allergy

## 2017-02-23 ENCOUNTER — Encounter: Payer: Self-pay | Admitting: Allergy

## 2017-02-23 VITALS — BP 126/80 | HR 72 | Temp 98.1°F | Resp 16 | Ht 69.5 in | Wt 264.6 lb

## 2017-02-23 DIAGNOSIS — H1010 Acute atopic conjunctivitis, unspecified eye: Secondary | ICD-10-CM

## 2017-02-23 DIAGNOSIS — J309 Allergic rhinitis, unspecified: Secondary | ICD-10-CM

## 2017-02-23 DIAGNOSIS — J4530 Mild persistent asthma, uncomplicated: Secondary | ICD-10-CM

## 2017-02-23 MED ORDER — AZELASTINE HCL 0.15 % NA SOLN: 2.0000 | Freq: Two times a day (BID) | NASAL | 5 refills | Status: DC

## 2017-02-23 MED ORDER — FLUTICASONE PROPIONATE HFA 110 MCG/ACT IN AERO: 2.0000 | INHALATION_SPRAY | Freq: Two times a day (BID) | RESPIRATORY_TRACT | 5 refills | Status: DC

## 2017-02-23 NOTE — Patient Instructions (Signed)
  Use Astepro 2 sprays twice daily.  Continue Nasonex 2 sprays daily  Continue Singulair each evening.  Continue allegra 180mg  (move administration to the evening).    Use Qvar 80 mcg 2 puffs twice a day with spaces.   During flares/illnesses increase to 3 puffs three times a day.   We will refill your Qvar with Flovent 131mcg due to insurance coverage (use the same as your Qvar).    ProAir Respiclick 2 puffs every 4 hours as needed for cough, wheeze, shortness of breath, chest tightness.  Saline nasal wash prior to medicated sprays-- and each evening at shower time.  Follow-up 6 months or sooner if needed.

## 2017-02-23 NOTE — Progress Notes (Signed)
Follow-up Note  RE: Regina Rice MRN: [id number] DOB: [birth year] Date of Office Visit: 02/23/2017   History of present illness: Regina Rice is a 51 y.o. female presenting today for follow-up of asthma and allergic rhinoconjunctivitis.  She was last seen in the office on 08/25/16 by myself.  Since this visit she reports she has been doing relatively well. She did have an episode of bronchitis several months ago that required antibiotics. She was using her albuterol frequently during this time. She has resolved from this. She is on Qvar that she takes 2 puffs twice a day. She does not have a spacer at this time. She also takes Singulair daily. Her biggest concern today is she wakes up with more nasal congestion and drainage she takes Allegra in the morning as well as Nasonex 2 sprays each nostril. She then takes Astepro in the evening 2 sprays each nostril. She reports that the nasal congestion and drainage with trigger cough which sometimes will trigger chest tightness and albuterol use. It is not every morning that this occurs.    Review of systems: Review of Systems  Constitutional: Negative for chills, fever and malaise/fatigue.  HENT: Positive for congestion. Negative for ear discharge, ear pain, nosebleeds, sinus pain, sore throat and tinnitus.   Eyes: Negative for discharge and redness.  Respiratory: Positive for cough. Negative for shortness of breath and wheezing.   Cardiovascular: Negative for chest pain.  Gastrointestinal: Negative for abdominal pain, heartburn, nausea and vomiting.  Musculoskeletal: Negative for joint pain and myalgias.  Skin: Negative for itching and rash.  Neurological: Negative for headaches.    All other systems negative unless noted above in HPI  Past medical/social/surgical/family history have been reviewed and are unchanged unless specifically indicated below.  No changes  Medication List: Allergies as of 02/23/2017      Reactions   Septra  [sulfamethoxazole-trimethoprim] Swelling   Amoxicillin Rash      Medication List       Accurate as of 02/23/17  4:57 PM. Always use your most recent med list.          Albuterol Sulfate 108 (90 Base) MCG/ACT Aepb Commonly known as:  PROAIR RESPICLICK Inhale 2 puffs into the lungs every 6 (six) hours as needed.   aspirin 81 MG tablet Take 81 mg by mouth daily.   Azelastine HCl 0.15 % Soln Commonly known as:  ASTEPRO Place 2 sprays into the nose daily.   beclomethasone 80 MCG/ACT inhaler Commonly known as:  QVAR Inhale 2 puffs into the lungs 2 (two) times daily.   glucosamine-chondroitin 500-400 MG tablet Take 1 tablet by mouth 2 (two) times daily.   mometasone 50 MCG/ACT nasal spray Commonly known as:  NASONEX USE 2 SPRAYS IN EACH NOSTRIL ONCE DAILY   montelukast 10 MG tablet Commonly known as:  SINGULAIR TAKE 1 TABLET (10 MG TOTAL) BY MOUTH AT BEDTIME.   MULTI-VITAMIN PO Take by mouth.   Omeprazole Magnesium 20.6 (20 Base) MG Cpdr Take 1 tablet by mouth.   TRI-PREVIFEM 0.18/0.215/0.25 MG-35 MCG tablet Generic drug:  Norgestimate-Ethinyl Estradiol Triphasic       Known medication allergies: Allergies  Allergen Reactions  . Septra [Sulfamethoxazole-Trimethoprim] Swelling  . Amoxicillin Rash     Physical examination: Blood pressure 126/80, pulse 72, temperature 98.1 F (36.7 C), temperature source Oral, resp. rate 16, height 5' 9.5" (1.765 m), weight 264 lb 9.6 oz (120 kg), SpO2 95 %.  General: Alert, interactive, in no acute distress. HEENT:  TMs pearly gray, turbinates moderately edematous without discharge, post-pharynx non erythematous. Neck: Supple without lymphadenopathy. Lungs: Clear to auscultation without wheezing, rhonchi or rales. {no increased work of breathing. CV: Normal S1, S2 without murmurs. Abdomen: Nondistended, nontender. Skin: Warm and dry, without lesions or rashes. Extremities:  No clubbing, cyanosis or edema. Neuro:   Grossly  intact.  Diagnositics/Labs: Spirometry: FEV1: 3.56L  115%, FVC: 3.98L 103%, ratio consistent with Nonobstructive pattern  Assessment and plan:   Allergic rhinoconjunctivitis  - Use Astepro 2 sprays twice daily.  - Continue Nasonex 2 sprays daily  - Continue Singulair each evening.  - Continue allegra 180mg  (move administration to the evening as it may be wearing off by the morning leading to increased morning symptoms).    - Saline nasal wash prior to medicated sprays-- and each evening at shower time.   Mild persistent asthma  - Use Qvar 80 mcg 2 puffs twice a day with spaces.   During flares/illnesses increase to 3 puffs three times a day.   We will refill your Qvar with Flovent 125mcg due to insurance coverage (use the same as your Qvar).    - ProAir Respiclick 2 puffs every 4 hours as needed for cough, wheeze, shortness of breath, chest tightness.  Follow-up 6 months or sooner if needed.  I appreciate the opportunity to take part in Shavana's care. Please do not hesitate to contact me with questions.  Sincerely,   Prudy Feeler, MD Allergy/Immunology [facility] [place]

## 2017-02-24 ENCOUNTER — Telehealth: Payer: Self-pay | Admitting: Allergy

## 2017-02-24 MED ORDER — BECLOMETHASONE DIPROP HFA 80 MCG/ACT IN AERB: 2.0000 | INHALATION_SPRAY | Freq: Two times a day (BID) | RESPIRATORY_TRACT | 3 refills | Status: DC

## 2017-02-24 MED FILL — QVAR REDIHALER 80 MCG/ACT A: 80 | 30 days supply | Qty: 11 | Fill #0

## 2017-02-24 NOTE — Telephone Encounter (Signed)
Spoke to patient advised we would send in Qvar 80 redihaler and to get the pharmacist to demonstrate correct use. Also patient did not use spacer and will bring spacer back to office. Rx sent

## 2017-02-24 NOTE — Telephone Encounter (Signed)
If her insurance covers the Qvar redihaler then lets have her use that instead of change to Flovent.  26mcg 2 puffs twice a day.  She will not need spacer with new device.   Please tell her to have the pharmacist demonstrate proper use of the device.    If she wants to continue use the Flovent (if she picked it up) until it is out then yes she should use with the spacer.

## 2017-02-24 NOTE — Telephone Encounter (Signed)
Pt called and said that you had called in Flovent but her ins will cover the new Qvar . But wants to know if she needs the spacer. [facility] on [address] [phone].

## 2017-02-25 ENCOUNTER — Other Ambulatory Visit: Payer: Self-pay | Admitting: Allergy

## 2017-02-25 NOTE — Telephone Encounter (Signed)
Patient was seen 02-23-17. Dr. Nelva Bush prescribed her Azelastine and she said her insurance, [employer], will not cover that. She would like an equivalent to that called in to [facility], if possible.

## 2017-02-26 MED ORDER — AZELASTINE HCL 0.1 % NA SOLN: 2.0000 | Freq: Two times a day (BID) | NASAL | 12 refills | Status: DC

## 2017-02-26 MED FILL — AZELASTINE HCL 137 MCG SPRY: 0.1 | 25 days supply | Qty: 30 | Fill #0

## 2017-02-26 NOTE — Telephone Encounter (Signed)
Called patient. Left message in regards to Azelastine 0.15%. I sent in a script for Azelastine 0.1% I left message stating if there was any other questions to give [place] a call back.

## 2017-03-12 ENCOUNTER — Other Ambulatory Visit: Payer: Self-pay | Admitting: *Deleted

## 2017-03-12 MED ORDER — MOMETASONE FUROATE 50 MCG/ACT NA SUSP: 2.0000 | Freq: Every day | NASAL | 5 refills | Status: DC

## 2017-03-12 MED FILL — MOMETASONE FUROATE 50 MCG S: 50 | 30 days supply | Qty: 17 | Fill #0

## 2017-03-16 MED FILL — TRI-PREVIFEM TABLET: 0.18/0.215/ | 84 days supply | Qty: 84 | Fill #4

## 2017-03-20 ENCOUNTER — Telehealth: Payer: Self-pay | Admitting: Allergy

## 2017-03-20 NOTE — Telephone Encounter (Signed)
Patient called and said she saw Dr. Nelva Bush, 02-23-17, and was prescribed an inhaler and given a spacer. She was told they would file with her insurance for the spacer. Later that week, she said Dr. Nelva Bush changed her inhaler and this one didn't need a spacer and was told that someone  would remove the claim from the insurance. She returned the spacer. She said she received a bill for the spacer for $47.36 and would like to know why.

## 2017-03-24 NOTE — Telephone Encounter (Signed)
Anderson Malta T will talk to nurse in [place] to contact pt about the bill she is getting from [employer] for the spacer - kt

## 2017-03-26 DIAGNOSIS — Z01419 Encounter for gynecological examination (general) (routine) without abnormal findings: Secondary | ICD-10-CM | POA: Diagnosis not present

## 2017-03-27 NOTE — Telephone Encounter (Signed)
Patient called back about this issue. I told her that I spoke with Juliann Pulse and the billing department did not handle this, the nurses do. She would like to know if this spacer has been removed from her insurance.

## 2017-03-27 NOTE — Telephone Encounter (Signed)
Called patient. I explained to her that [employer] handles all returns. It can also take a billing cycle(30 days). I also informed patient that she can contact [employer], I gave her the number. I also informed her that [employer] rep usually comes in every week on Friday, if she comes in today I will mention the situation to her.

## 2017-03-31 MED FILL — MONTELUKAST SOD 10 MG TAB: 10 | 30 days supply | Qty: 30 | Fill #1

## 2017-03-31 MED FILL — AZELASTINE HCL 137 MCG SPRY: 0.1 | 25 days supply | Qty: 30 | Fill #1

## 2017-04-07 DIAGNOSIS — H524 Presbyopia: Secondary | ICD-10-CM | POA: Diagnosis not present

## 2017-04-07 DIAGNOSIS — H5213 Myopia, bilateral: Secondary | ICD-10-CM | POA: Diagnosis not present

## 2017-04-07 DIAGNOSIS — H52203 Unspecified astigmatism, bilateral: Secondary | ICD-10-CM | POA: Diagnosis not present

## 2017-04-10 MED FILL — QVAR REDIHALER 80 MCG/ACT A: 80 | 30 days supply | Qty: 11 | Fill #1

## 2017-04-10 MED FILL — MOMETASONE FUROATE 50 MCG S: 50 | 30 days supply | Qty: 17 | Fill #1

## 2017-04-22 ENCOUNTER — Other Ambulatory Visit: Payer: Self-pay | Admitting: Obstetrics and Gynecology

## 2017-04-22 DIAGNOSIS — Z1231 Encounter for screening mammogram for malignant neoplasm of breast: Secondary | ICD-10-CM

## 2017-04-27 MED FILL — MONTELUKAST SOD 10 MG TAB: 10 | 30 days supply | Qty: 30 | Fill #2

## 2017-04-28 ENCOUNTER — Ambulatory Visit
Admission: RE | Admit: 2017-04-28 | Discharge: 2017-04-28 | Disposition: A | Discharge status: Home / Self Care | Payer: 59 | Source: Ambulatory Visit | Attending: Obstetrics and Gynecology | Admitting: Obstetrics and Gynecology

## 2017-04-28 DIAGNOSIS — Z1231 Encounter for screening mammogram for malignant neoplasm of breast: Secondary | ICD-10-CM

## 2017-05-07 MED FILL — AZELASTINE HCL 137 MCG SPRY: 0.1 | 25 days supply | Qty: 30 | Fill #2

## 2017-05-07 MED FILL — MOMETASONE FUROATE 50 MCG S: 50 | 30 days supply | Qty: 17 | Fill #2

## 2017-05-27 ENCOUNTER — Other Ambulatory Visit: Payer: Self-pay | Admitting: Allergy and Immunology

## 2017-05-27 ENCOUNTER — Telehealth: Payer: Self-pay | Admitting: Allergy and Immunology

## 2017-05-27 MED ORDER — MONTELUKAST SODIUM 10 MG PO TABS: 10.0000 mg | ORAL_TABLET | Freq: Every day | ORAL | 2 refills | Status: DC

## 2017-05-27 MED FILL — QVAR REDIHALER 80 MCG/ACT A: 80 | 30 days supply | Qty: 11 | Fill #2

## 2017-05-27 MED FILL — MONTELUKAST SOD 10 MG TAB: 10 | 30 days supply | Qty: 30 | Fill #0

## 2017-05-27 NOTE — Telephone Encounter (Signed)
Pt called and said that we denied her montelkast  and she was here 02/23/2017 and not suppose to come back for 6 month.  Please call meds in

## 2017-05-27 NOTE — Telephone Encounter (Signed)
Left message to let pt know I sent a 3 month supply of refills. She is due to be seen in October and did not get enough of refills upon her visit.

## 2017-06-08 MED FILL — TRI-PREVIFEM TABLET: 0.18/0.215/ | 84 days supply | Qty: 84 | Fill #0

## 2017-06-26 MED FILL — MONTELUKAST SOD 10 MG TAB: 10 | 30 days supply | Qty: 30 | Fill #1

## 2017-06-26 MED FILL — AZELASTINE HCL 137 MCG SPRY: 0.1 | 25 days supply | Qty: 30 | Fill #3

## 2017-06-26 MED FILL — MOMETASONE FUROATE 50 MCG S: 50 | 30 days supply | Qty: 17 | Fill #3

## 2017-07-16 DIAGNOSIS — J069 Acute upper respiratory infection, unspecified: Secondary | ICD-10-CM | POA: Diagnosis not present

## 2017-07-16 DIAGNOSIS — J45901 Unspecified asthma with (acute) exacerbation: Secondary | ICD-10-CM | POA: Diagnosis not present

## 2017-07-16 MED FILL — CHERATUSSIN AC SYRUP: 100-10 | 3 days supply | Qty: 120 | Fill #0

## 2017-07-16 MED FILL — QVAR REDIHALER 80 MCG/ACT A: 80 | 30 days supply | Qty: 11 | Fill #3

## 2017-07-16 MED FILL — predniSONE 20 MG TABS: 20 | 9 days supply | Qty: 18 | Fill #0

## 2017-07-20 DIAGNOSIS — J329 Chronic sinusitis, unspecified: Secondary | ICD-10-CM | POA: Diagnosis not present

## 2017-07-20 DIAGNOSIS — J45901 Unspecified asthma with (acute) exacerbation: Secondary | ICD-10-CM | POA: Diagnosis not present

## 2017-07-20 DIAGNOSIS — J069 Acute upper respiratory infection, unspecified: Secondary | ICD-10-CM | POA: Diagnosis not present

## 2017-07-20 MED FILL — AZITHROMYCIN 250 MG TABLET: 250 | 5 days supply | Qty: 6 | Fill #0

## 2017-07-23 ENCOUNTER — Encounter: Payer: Self-pay | Admitting: Allergy

## 2017-07-23 ENCOUNTER — Ambulatory Visit (INDEPENDENT_AMBULATORY_CARE_PROVIDER_SITE_OTHER): Payer: 59 | Admitting: Allergy

## 2017-07-23 VITALS — BP 150/98 | HR 76 | Temp 97.9°F | Resp 18

## 2017-07-23 DIAGNOSIS — J4531 Mild persistent asthma with (acute) exacerbation: Secondary | ICD-10-CM

## 2017-07-23 DIAGNOSIS — H1010 Acute atopic conjunctivitis, unspecified eye: Secondary | ICD-10-CM

## 2017-07-23 DIAGNOSIS — J4530 Mild persistent asthma, uncomplicated: Secondary | ICD-10-CM | POA: Diagnosis not present

## 2017-07-23 DIAGNOSIS — J309 Allergic rhinitis, unspecified: Secondary | ICD-10-CM | POA: Diagnosis not present

## 2017-07-23 DIAGNOSIS — J0100 Acute maxillary sinusitis, unspecified: Secondary | ICD-10-CM

## 2017-07-23 MED ORDER — ALBUTEROL SULFATE (2.5 MG/3ML) 0.083% IN NEBU: 2.5000 mg | INHALATION_SOLUTION | RESPIRATORY_TRACT | 1 refills | Status: DC | PRN

## 2017-07-23 MED ORDER — MOXIFLOXACIN HCL 400 MG PO TABS: ORAL_TABLET | ORAL | 0 refills | Status: DC

## 2017-07-23 MED FILL — ALBUTEROL 0.083% INHAL SOLN: (2.5 MG/3ML | 30 days supply | Qty: 150 | Fill #0

## 2017-07-23 MED FILL — MOXIFLOXACIN HCL 400 MG TAB: 400 | 10 days supply | Qty: 10 | Fill #0

## 2017-07-23 NOTE — Progress Notes (Signed)
Follow-up Note  RE: Regina Rice MRN: [id number] DOB: 17-Jan-1966 Date of Office Visit: 07/23/2017   History of present illness: Regina Rice is a 51 y.o. female presenting today for sick visit.  She was last seen in the office on 02/23/17 for follow-up of allergic rhinoconjunctivitis and asthma.  She started 2 weeks ago with nasal drainage and reports the drainage "settled in my chest".  She states the drainage got worse and last week she was very miserable with increase drainage and congestion, chest tightness, cough and shortness of breath.  She saw her PCP on Thursday of last week her thought she had a viral URI and gave her a steroid shot and prednisone course of 65m x3 days, 452mx 3 days, 2070m 2 days.   She states the steroid didn't seem to help much.  So she went back to PCP on Monday of this week who gave her a breathing treatment and a zpak which she has 2 days left of.  She is also taking Mucinex DM once a day and cheratussin in the evening.  She does not feel any better on these medications and still reports she has chest tightness and is hoarse and has continued drainage.  She denies any known sick contacts.  Also denies any fevers.   She continues on her routine medications of Qvar 80m58m puffs twice a day, singulair at night, Allegra daily, Astepro twice a day and nasonex daily.  She is not doing any nasal saline rinse but does think she has the kit at home.   Review of systems: Review of Systems  Constitutional: Positive for malaise/fatigue. Negative for chills and fever.  HENT: Positive for congestion and sore throat. Negative for ear discharge, ear pain, nosebleeds, sinus pain and tinnitus.   Eyes: Negative for pain, discharge and redness.  Respiratory: Positive for cough, shortness of breath and wheezing. Negative for sputum production.   Cardiovascular: Negative for chest pain.  Gastrointestinal: Negative for abdominal pain, constipation, diarrhea, nausea and vomiting.    Skin: Negative for itching and rash.    All other systems negative unless noted above in HPI  Past medical/social/surgical/family history have been reviewed and are unchanged unless specifically indicated below.  No changes  Medication List: Allergies as of 07/23/2017      Reactions   Septra [sulfamethoxazole-trimethoprim] Swelling   Amoxicillin Rash      Medication List       Accurate as of 07/23/17  4:58 PM. Always use your most recent med list.          Albuterol Sulfate 108 (90 Base) MCG/ACT Aepb Commonly known as:  PROAIR RESPICLICK Inhale 2 puffs into the lungs every 6 (six) hours as needed.   aspirin 81 MG tablet Take 81 mg by mouth daily.   azelastine 0.1 % nasal spray Commonly known as:  ASTELIN Place 2 sprays into both nostrils 2 (two) times daily. Use in each nostril as directed   azithromycin 250 MG tablet Commonly known as:  ZITHROMAX TAKE 2 TABLETS BY MOUTH ON DAY 1 THEN TAKE 1 TABLET DAILY FOR THE NEXT 4 DAYS   beclomethasone 80 MCG/ACT inhaler Commonly known as:  QVAR REDIHALER Inhale 2 puffs into the lungs 2 (two) times daily.   CHERATUSSIN AC 100-10 MG/5ML syrup Generic drug:  guaiFENesin-codeine TAKE 5 10MLS BY MOUTH EVERY 6 HOURS AS NEEDED FOR COUGH   fluticasone 110 MCG/ACT inhaler Commonly known as:  FLOVENT HFA Inhale 2 puffs into  the lungs 2 (two) times daily.   glucosamine-chondroitin 500-400 MG tablet Take 1 tablet by mouth 2 (two) times daily.   mometasone 50 MCG/ACT nasal spray Commonly known as:  NASONEX Place 2 sprays into the nose daily.   montelukast 10 MG tablet Commonly known as:  SINGULAIR Take 1 tablet (10 mg total) by mouth at bedtime.   MULTI-VITAMIN PO Take by mouth.   Omeprazole Magnesium 20.6 (20 Base) MG Cpdr Take 1 tablet by mouth.   predniSONE 20 MG tablet Commonly known as:  DELTASONE TAKE 3 TABLETS BY MOUTH DAILY FOR 3 DAYS, THEN TAKE 2 TABS BY MOUTH DAILY FOR 3 DAYS, THEN 1 TABLET BY MOUTH DAILY FOR 3  DAYS   TAKE IN THE   TRI-PREVIFEM 0.18/0.215/0.25 MG-35 MCG tablet Generic drug:  Norgestimate-Ethinyl Estradiol Triphasic       Known medication allergies: Allergies  Allergen Reactions  . Septra [Sulfamethoxazole-Trimethoprim] Swelling  . Amoxicillin Rash     Physical examination: Blood pressure (!) 150/98, pulse 76, temperature 97.9 F (36.6 C), temperature source Oral, resp. rate 18, SpO2 95 %.  General: Alert, interactive, in no acute distress. HEENT: PERRLA, TMs pearly gray, turbinates moderately edematous without discharge, post-pharynx non erythematous.  Neck: Supple without lymphadenopathy. Lungs: Mildly decreased breath sounds bilaterally without wheezing, rhonchi or rales. {no increased work of breathing. CV: Normal S1, S2 without murmurs. Abdomen: Nondistended, nontender. Skin: Warm and dry, without lesions or rashes. Extremities:  No clubbing, cyanosis or edema. Neuro:   Grossly intact.  Diagnositics/Labs: None today  Assessment and plan:   Mild persistent asthma with exacerbation  - Use Qvar 80 mcg 2 puffs twice a day with spaces.   During flares/illnesses increase to 3 puffs three times a day.     - ProAir Respiclick 2 puffs every 4 hours as needed for cough, wheeze, shortness of breath, chest tightness.  Will provide with nebulizer to use albuterol 1 vial every 4 hours as needed.  Use either nebulizer or inhaler at a time.    - provided with prednisone extension pack 28m x 5 days if symptoms are not improved over weekend/early next week  - may continue as needed use cheratussine for cough  Acute sinusitis with LRI  - finish out your azithromycin and will broaden coverage as still symptomatic with Avelox 4054mdaily x 10 days  - continue Mucinex DM 120047maily with plenty of water  - recommend nasal saline rinse daily and warm salt water gargles to help soothe throat.    Allergic rhinoconjunctivitis  - Use Astepro 2 sprays twice daily.  - Continue  Nasonex 2 sprays daily  - Continue Singulair each evening.  - Continue allegra 180m58ml  Follow-up 6 months or sooner if needed.  I appreciate the opportunity to take part in Regina Rice's care. Please do not hesitate to contact me with questions.  Sincerely,   ShayPrudy Feeler Allergy/Immunology [facility]

## 2017-07-23 NOTE — Patient Instructions (Addendum)
Mild persistent asthma with exacerbation  - Use Qvar 80 mcg 2 puffs twice a day with spaces.   During flares/illnesses increase to 3 puffs three times a day.     - ProAir Respiclick 2 puffs every 4 hours as needed for cough, wheeze, shortness of breath, chest tightness.  Will provide with nebulizer to use albuterol 1 vial every 4 hours as needed.  Use either nebulizer or inhaler at a time.    - provided with prednisone extension pack 40mg  x 5 days if symptoms are not improved over weekend/early next week  - may continue as needed use cheratussine for cough  Acute sinusitis with LRI  - finish out your azithromycin and will broaden coverage as still symptomatic with Avelox 400mg  daily x 10 days  - continue Mucinex DM 1200mg  daily with plenty of water  - recommend nasal saline rinse daily and warm salt water gargles to help soothe throat.    Allergic rhinoconjunctivitis  - Use Astepro 2 sprays twice daily.  - Continue Nasonex 2 sprays daily  - Continue Singulair each evening.  - Continue allegra 180mg    Follow-up 6 months or sooner if needed.

## 2017-08-03 MED FILL — MOMETASONE FUROATE 50 MCG S: 50 | 30 days supply | Qty: 17 | Fill #4

## 2017-08-03 MED FILL — AZELASTINE HCL 137 MCG SPRY: 0.1 | 25 days supply | Qty: 30 | Fill #4

## 2017-08-03 MED FILL — MONTELUKAST SOD 10 MG TAB: 10 | 30 days supply | Qty: 30 | Fill #2

## 2017-08-24 ENCOUNTER — Other Ambulatory Visit: Payer: Self-pay | Admitting: Allergy

## 2017-08-24 MED FILL — QVAR REDIHALER 80 MCG/ACT A: 80 | 30 days supply | Qty: 11 | Fill #0

## 2017-08-26 MED FILL — NORG-EE 0.18-0.215-0.25/0.0: 0.18/0.215/ | 84 days supply | Qty: 84 | Fill #0

## 2017-08-27 DIAGNOSIS — Z23 Encounter for immunization: Secondary | ICD-10-CM | POA: Diagnosis not present

## 2017-09-11 ENCOUNTER — Other Ambulatory Visit: Payer: Self-pay | Admitting: Allergy

## 2017-09-11 MED FILL — AZELASTINE HCL 137 MCG SPRY: 0.1 | 25 days supply | Qty: 30 | Fill #5

## 2017-09-11 MED FILL — MOMETASONE FUROATE 50 MCG S: 50 | 30 days supply | Qty: 17 | Fill #5

## 2017-09-11 MED FILL — MONTELUKAST SOD 10 MG TAB: 10 | 30 days supply | Qty: 30 | Fill #0

## 2017-10-07 MED FILL — MONTELUKAST SOD 10 MG TAB: 10 | 30 days supply | Qty: 30 | Fill #1

## 2017-10-23 ENCOUNTER — Other Ambulatory Visit: Payer: Self-pay | Admitting: Allergy

## 2017-10-23 MED FILL — MOMETASONE FUROATE 50 MCG S: 50 | 30 days supply | Qty: 17 | Fill #0

## 2017-10-23 MED FILL — AZELASTINE HCL 137 MCG SPRY: 0.1 | 25 days supply | Qty: 30 | Fill #6

## 2017-10-23 NOTE — Telephone Encounter (Signed)
RF on Nasonex x 1 with 3 refills at [facility]

## 2017-11-09 MED FILL — QVAR REDIHALER 80 MCG/ACT A: 80 | 30 days supply | Qty: 11 | Fill #1

## 2017-11-09 MED FILL — MONTELUKAST SOD 10 MG TAB: 10 | 30 days supply | Qty: 30 | Fill #2

## 2017-11-19 MED FILL — NORG-EE 0.18-0.215-0.25/0.0: 0.18/0.215/ | 84 days supply | Qty: 84 | Fill #1

## 2017-12-02 ENCOUNTER — Other Ambulatory Visit: Payer: Self-pay | Admitting: Allergy

## 2017-12-02 DIAGNOSIS — J4530 Mild persistent asthma, uncomplicated: Secondary | ICD-10-CM

## 2017-12-02 MED FILL — AZELASTINE HCL 137 MCG SPRY: 0.1 | 25 days supply | Qty: 30 | Fill #7

## 2017-12-02 MED FILL — MOMETASONE FUROATE 50 MCG S: 50 | 30 days supply | Qty: 17 | Fill #1

## 2017-12-02 MED FILL — PROAIR RESPICLICK INHAL PWD: 108 (90 BAS | 25 days supply | Qty: 1 | Fill #0

## 2017-12-04 ENCOUNTER — Encounter: Payer: Self-pay | Admitting: Allergy

## 2017-12-04 ENCOUNTER — Ambulatory Visit: Payer: 59 | Admitting: Allergy

## 2017-12-04 VITALS — BP 136/90 | HR 73 | Temp 97.6°F | Resp 17 | Wt 261.8 lb

## 2017-12-04 DIAGNOSIS — J309 Allergic rhinitis, unspecified: Secondary | ICD-10-CM

## 2017-12-04 DIAGNOSIS — J0100 Acute maxillary sinusitis, unspecified: Secondary | ICD-10-CM

## 2017-12-04 DIAGNOSIS — H1010 Acute atopic conjunctivitis, unspecified eye: Secondary | ICD-10-CM

## 2017-12-04 DIAGNOSIS — J4531 Mild persistent asthma with (acute) exacerbation: Secondary | ICD-10-CM

## 2017-12-04 MED ORDER — MOXIFLOXACIN HCL 400 MG PO TABS: 400.0000 mg | ORAL_TABLET | Freq: Every day | ORAL | 0 refills | Status: DC

## 2017-12-04 MED ORDER — MONTELUKAST SODIUM 10 MG PO TABS: 10.0000 mg | ORAL_TABLET | Freq: Every day | ORAL | 1 refills | Status: DC

## 2017-12-04 MED ORDER — GUAIFENESIN-CODEINE 100-10 MG/5ML PO SYRP: 5.0000 mL | ORAL_SOLUTION | Freq: Two times a day (BID) | ORAL | 0 refills | Status: AC | PRN

## 2017-12-04 MED FILL — MOXIFLOXACIN HCL 400 MG TAB: 400 | 10 days supply | Qty: 10 | Fill #0

## 2017-12-04 MED FILL — MONTELUKAST SOD 10 MG TAB: 10 | 90 days supply | Qty: 90 | Fill #0

## 2017-12-04 MED FILL — CHERATUSSIN AC SYRUP: 100-10 | 7 days supply | Qty: 118 | Fill #0

## 2017-12-04 NOTE — Patient Instructions (Signed)
Acute sinusitis with lower respiratory infection  - take Avelox 400mg  daily x 10 days  - provided with prednisone pack to start in next 2 days if symptoms not improved with starting antibiotic  - recommend nasal saline rinse daily and warm salt water gargles to help soothe throat.    Mild persistent asthma with exacerbation  - Use Qvar 80 mcg 2 puffs twice a day with spaces.   During flares/illnesses increase to 3 puffs three times a day.     - ProAir Respiclick 2 puffs every 4 hours as needed for cough, wheeze, shortness of breath, chest tightness.  Will provide with nebulizer to use albuterol 1 vial every 4 hours as needed.  Use either nebulizer or inhaler at a time.    - may use as needed cheratussine for cough  Allergic rhinoconjunctivitis  - Use Astepro 2 sprays twice daily.  - Continue Nasonex 2 sprays daily  - Continue Singulair each evening.  - Continue allegra 180mg    Follow-up 6 months or sooner if needed.

## 2017-12-04 NOTE — Progress Notes (Signed)
Follow-up Note  RE: IMAGENE BOSS MRN: [id number] DOB: Feb 16, 1966 Date of Office Visit: 12/04/2017   History of present illness: Regina Rice is a 52 y.o. female presenting today for sick visit.  She was last seen in the office on 07/23/17 by myself.   2 weeks ago she reports she started out with nasal congestion and drainage.  This past weekend she states the head congestion has "settled" into her lungs.  She has been having increase cough all week that is worse at night.  She is having nighttime awakenings.  She has used her nebulizer several nights this past week and has been using her albuterol inhaler several days a week for past week.  She also was using Mucinex but did not feel it was providing any relief thus she stopped. She has been using ibuprofen as well.  She denies any fevers, body aches or chills.  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   She does her best to get her students to wash hands/use hand sanitizer and to cover mouth when they cough.     She otherwise is taking her routine medications of Qvar 2 puffs twice a day, Nasonex in AM and Astepro in PM and Singulair at bedtime and Allegra daily.    Review of systems: Review of Systems  Constitutional: Negative for chills, fever and malaise/fatigue.  HENT: Positive for congestion and sore throat. Negative for ear discharge, ear pain, nosebleeds, sinus pain and tinnitus.   Eyes: Negative for pain, discharge and redness.  Respiratory: Positive for cough and shortness of breath. Negative for sputum production and wheezing.   Cardiovascular: Negative for chest pain.  Gastrointestinal: Negative for abdominal pain, constipation, diarrhea, heartburn, nausea and vomiting.  Musculoskeletal: Negative for joint pain and myalgias.  Skin: Negative for itching and rash.  Neurological: Negative for headaches.    All other systems negative unless noted above in HPI  Past medical/social/surgical/family  history have been reviewed and are unchanged unless specifically indicated below.  No changes  Medication List: Allergies as of 12/04/2017      Reactions   Septra [sulfamethoxazole-trimethoprim] Swelling   Amoxicillin Rash      Medication List        Accurate as of 12/04/17  1:09 PM. Always use your most recent med list.          albuterol (2.5 MG/3ML) 0.083% nebulizer solution Commonly known as:  PROVENTIL Take 3 mLs (2.5 mg total) by nebulization every 4 (four) hours as needed for wheezing or shortness of breath.   PROAIR RESPICLICK 258 (90 Base) MCG/ACT Aepb Generic drug:  Albuterol Sulfate INHALE 2 PUFFS INTO THE LUNGS EVERY 6 HOURS AS NEEDED.   aspirin 81 MG tablet Take 81 mg by mouth daily.   azelastine 0.1 % nasal spray Commonly known as:  ASTELIN Place 2 sprays into both nostrils 2 (two) times daily. Use in each nostril as directed   fexofenadine 180 MG tablet Commonly known as:  ALLEGRA Take 180 mg by mouth daily.   fluticasone 110 MCG/ACT inhaler Commonly known as:  FLOVENT HFA Inhale 2 puffs into the lungs 2 (two) times daily.   glucosamine-chondroitin 500-400 MG tablet Take 1 tablet by mouth 2 (two) times daily.   mometasone 50 MCG/ACT nasal spray Commonly known as:  NASONEX INHALE 2 SPRAYS INTO EACH NOSTRIL DAILY   montelukast 10 MG tablet Commonly known as:  SINGULAIR TAKE 1 TABLET (10 MG  TOTAL) BY MOUTH AT BEDTIME.   MULTI-VITAMIN PO Take by mouth.   Omeprazole Magnesium 20.6 (20 Base) MG Cpdr Take 1 tablet by mouth.   QVAR REDIHALER 80 MCG/ACT inhaler Generic drug:  beclomethasone INHALE 2 PUFFS INTO THE LUNGS 2 TIMES DAILY.   TRI-PREVIFEM 0.18/0.215/0.25 MG-35 MCG tablet Generic drug:  Norgestimate-Ethinyl Estradiol Triphasic       Known medication allergies: Allergies  Allergen Reactions  . Septra [Sulfamethoxazole-Trimethoprim] Swelling  . Amoxicillin Rash     Physical examination: Blood pressure 136/90, pulse 73,  temperature 97.6 F (36.4 C), temperature source Oral, resp. rate 17, weight 261 lb 12.8 oz (118.8 kg), SpO2 96 %.  General: Alert, interactive, in no acute distress. HEENT: PERRLA, TMs pearly gray, turbinates moderately edematous with thick discharge, post-pharynx non erythematous. Neck: Supple without lymphadenopathy. Lungs: Clear to auscultation without wheezing, rhonchi or rales. {no increased work of breathing. CV: Normal S1, S2 without murmurs. Abdomen: Nondistended, nontender. Skin: Warm and dry, without lesions or rashes. Extremities:  No clubbing, cyanosis or edema. Neuro:   Grossly intact.  Diagnositics/Labs:  Spirometry: FEV1: 3.11L  91%, FVC: 3.55L  82%, ratio consistent with nonobstructive pattern  Assessment and plan:   Acute sinusitis with LRI  - take Avelox 400mg  daily x 10 days  - provided with prednisone pack to start in next 2 days if symptoms not improved with starting antibiotic  - recommend nasal saline rinse daily and warm salt water gargles to help soothe throat.    Mild persistent asthma with exacerbation  - Use Qvar 80 mcg 2 puffs twice a day with spaces.   During flares/illnesses increase to 3 puffs three times a day.     - ProAir Respiclick 2 puffs every 4 hours as needed for cough, wheeze, shortness of breath, chest tightness.  Will provide with nebulizer to use albuterol 1 vial every 4 hours as needed.  Use either nebulizer or inhaler at a time.    - may use as needed cheratussine for cough  Allergic rhinoconjunctivitis  - Use Astepro 2 sprays twice daily.  - Continue Nasonex 2 sprays daily  - Continue Singulair each evening.  - Continue allegra 180mg    Follow-up 6 months or sooner if needed.  I appreciate the opportunity to take part in Devany's care. Please do not hesitate to contact me with questions.  Sincerely,   Prudy Feeler, MD Allergy/Immunology [facility] [place] [facility] [place]

## 2018-01-01 MED FILL — QVAR REDIHALER 80 MCG/ACT A: 80 | 30 days supply | Qty: 11 | Fill #2

## 2018-01-01 MED FILL — MOMETASONE FUROATE 50 MCG S: 50 | 30 days supply | Qty: 17 | Fill #2

## 2018-01-01 MED FILL — AZELASTINE HCL 137 MCG SPRY: 0.1 | 25 days supply | Qty: 30 | Fill #8

## 2018-02-02 DIAGNOSIS — Z1211 Encounter for screening for malignant neoplasm of colon: Secondary | ICD-10-CM | POA: Diagnosis not present

## 2018-02-02 DIAGNOSIS — E669 Obesity, unspecified: Secondary | ICD-10-CM | POA: Diagnosis not present

## 2018-02-02 DIAGNOSIS — Z136 Encounter for screening for cardiovascular disorders: Secondary | ICD-10-CM | POA: Diagnosis not present

## 2018-02-02 DIAGNOSIS — Z0000 Encounter for general adult medical examination without abnormal findings: Secondary | ICD-10-CM | POA: Diagnosis not present

## 2018-02-08 MED FILL — MOMETASONE FUROATE 50 MCG S: 50 | 30 days supply | Qty: 17 | Fill #3

## 2018-02-08 MED FILL — TRI FEMYNOR 28 TABLET: 0.18/0.215/ | 84 days supply | Qty: 84 | Fill #0

## 2018-02-08 MED FILL — AZELASTINE HCL 137 MCG SPRY: 0.1 | 25 days supply | Qty: 30 | Fill #9

## 2018-02-18 ENCOUNTER — Other Ambulatory Visit: Payer: Self-pay | Admitting: Allergy

## 2018-02-18 MED FILL — QVAR REDIHALER 80 MCG/ACT A: 80 | 30 days supply | Qty: 11 | Fill #0

## 2018-03-09 MED FILL — MONTELUKAST SOD 10 MG TAB: 10 | 90 days supply | Qty: 90 | Fill #1

## 2018-03-23 ENCOUNTER — Other Ambulatory Visit: Payer: Self-pay | Admitting: Allergy

## 2018-03-23 MED FILL — MOMETASONE FUROATE 50 MCG S: 50 | 30 days supply | Qty: 17 | Fill #0

## 2018-03-23 MED FILL — AZELASTINE HCL 137 MCG SPRY: 0.1 | 25 days supply | Qty: 30 | Fill #0

## 2018-03-26 MED FILL — QVAR REDIHALER 80 MCG/ACT A: 80 | 30 days supply | Qty: 11 | Fill #1

## 2018-03-29 DIAGNOSIS — Z01419 Encounter for gynecological examination (general) (routine) without abnormal findings: Secondary | ICD-10-CM | POA: Diagnosis not present

## 2018-04-23 MED FILL — TRI FEMYNOR 28 TABLET: 0.18/0.215/ | 84 days supply | Qty: 84 | Fill #1

## 2018-04-23 MED FILL — AZELASTINE HCL 137 MCG SPRY: 0.1 | 25 days supply | Qty: 30 | Fill #1

## 2018-04-23 MED FILL — MOMETASONE FUROATE 50 MCG S: 50 | 30 days supply | Qty: 17 | Fill #1

## 2018-04-28 DIAGNOSIS — H5213 Myopia, bilateral: Secondary | ICD-10-CM | POA: Diagnosis not present

## 2018-04-28 DIAGNOSIS — H52203 Unspecified astigmatism, bilateral: Secondary | ICD-10-CM | POA: Diagnosis not present

## 2018-04-28 DIAGNOSIS — H524 Presbyopia: Secondary | ICD-10-CM | POA: Diagnosis not present

## 2018-05-12 ENCOUNTER — Other Ambulatory Visit: Payer: Self-pay | Admitting: Obstetrics and Gynecology

## 2018-05-12 DIAGNOSIS — Z1231 Encounter for screening mammogram for malignant neoplasm of breast: Secondary | ICD-10-CM

## 2018-05-19 MED FILL — PEG-3350 SOLUTION: 420 | 1 days supply | Qty: 4000 | Fill #0

## 2018-05-19 MED FILL — AZELASTINE HCL 137 MCG SPRY: 0.1 | 25 days supply | Qty: 30 | Fill #2

## 2018-05-19 MED FILL — MOMETASONE FUROATE 50 MCG S: 50 | 30 days supply | Qty: 17 | Fill #2

## 2018-05-19 MED FILL — QVAR REDIHALER 80 MCG/ACT A: 80 | 30 days supply | Qty: 11 | Fill #2

## 2018-05-24 DIAGNOSIS — Z1211 Encounter for screening for malignant neoplasm of colon: Secondary | ICD-10-CM | POA: Diagnosis not present

## 2018-05-24 DIAGNOSIS — K5730 Diverticulosis of large intestine without perforation or abscess without bleeding: Secondary | ICD-10-CM | POA: Diagnosis not present

## 2018-05-24 DIAGNOSIS — D126 Benign neoplasm of colon, unspecified: Secondary | ICD-10-CM | POA: Diagnosis not present

## 2018-06-08 ENCOUNTER — Ambulatory Visit
Admission: RE | Admit: 2018-06-08 | Discharge: 2018-06-08 | Disposition: A | Discharge status: Home / Self Care | Payer: 59 | Source: Ambulatory Visit | Attending: Obstetrics and Gynecology | Admitting: Obstetrics and Gynecology

## 2018-06-08 DIAGNOSIS — Z1231 Encounter for screening mammogram for malignant neoplasm of breast: Secondary | ICD-10-CM

## 2018-06-09 ENCOUNTER — Other Ambulatory Visit: Payer: Self-pay | Admitting: Obstetrics and Gynecology

## 2018-06-09 DIAGNOSIS — R928 Other abnormal and inconclusive findings on diagnostic imaging of breast: Secondary | ICD-10-CM

## 2018-06-11 ENCOUNTER — Ambulatory Visit
Admission: RE | Admit: 2018-06-11 | Discharge: 2018-06-11 | Disposition: A | Discharge status: Home / Self Care | Payer: 59 | Source: Ambulatory Visit | Attending: Obstetrics and Gynecology | Admitting: Obstetrics and Gynecology

## 2018-06-11 ENCOUNTER — Other Ambulatory Visit: Payer: Self-pay | Admitting: Allergy

## 2018-06-11 DIAGNOSIS — R928 Other abnormal and inconclusive findings on diagnostic imaging of breast: Secondary | ICD-10-CM

## 2018-06-11 DIAGNOSIS — J4531 Mild persistent asthma with (acute) exacerbation: Secondary | ICD-10-CM

## 2018-06-11 DIAGNOSIS — N6001 Solitary cyst of right breast: Secondary | ICD-10-CM | POA: Diagnosis not present

## 2018-06-11 DIAGNOSIS — J309 Allergic rhinitis, unspecified: Secondary | ICD-10-CM

## 2018-06-11 DIAGNOSIS — H1010 Acute atopic conjunctivitis, unspecified eye: Secondary | ICD-10-CM

## 2018-06-11 MED FILL — MONTELUKAST SOD 10 MG TAB: 10 | 30 days supply | Qty: 30 | Fill #0

## 2018-07-02 ENCOUNTER — Ambulatory Visit: Payer: 59 | Admitting: Allergy

## 2018-07-02 ENCOUNTER — Encounter: Payer: Self-pay | Admitting: Allergy

## 2018-07-02 VITALS — BP 136/88 | HR 70 | Temp 97.7°F | Resp 16 | Ht 71.0 in | Wt 268.0 lb

## 2018-07-02 DIAGNOSIS — J4531 Mild persistent asthma with (acute) exacerbation: Secondary | ICD-10-CM

## 2018-07-02 DIAGNOSIS — J309 Allergic rhinitis, unspecified: Secondary | ICD-10-CM

## 2018-07-02 DIAGNOSIS — H1010 Acute atopic conjunctivitis, unspecified eye: Secondary | ICD-10-CM

## 2018-07-02 DIAGNOSIS — J0100 Acute maxillary sinusitis, unspecified: Secondary | ICD-10-CM | POA: Diagnosis not present

## 2018-07-02 MED ORDER — MOXIFLOXACIN HCL 400 MG PO TABS: 400.0000 mg | ORAL_TABLET | Freq: Every day | ORAL | 0 refills | Status: AC

## 2018-07-02 MED FILL — MOXIFLOXACIN HCL 400 MG TAB: 400 | 10 days supply | Qty: 10 | Fill #0

## 2018-07-02 NOTE — Progress Notes (Signed)
Follow-up Note  RE: Regina Rice MRN: [id number] DOB: 03/08/1966 Date of Office Visit: 07/02/2018   History of present illness: Regina Rice is a 52 y.o. female presenting today for follow-up/sick visit.  She was last seen in the office on 12/04/17 by myself.  At this visit she was diagnosed with acute sinusitis treated with avelox and prednisone.  She returns today as she is having similar symptoms as last visit.  She states she has a HA that won't go away, sinus pressure and pain in forehead and maxillary sinus, nasal drainage, sore throat and chest tightness.  She states she feels like her head is going to explode.  Symptoms have been ongoing for a week now and not improving.  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  She states a lot of her students are very "snotty."  She states she has been using mucinex and has needed to use her nebulizer several nights ago and again last night.   She is using her Qvar 2 puffs twice a day.  She also has astepro, nasaonex, singulair and allegra that she is taking as prescribed.    Review of systems: Review of Systems  Constitutional: Negative for chills, fever and malaise/fatigue.  HENT: Positive for congestion, sinus pain and sore throat. Negative for ear discharge, ear pain and nosebleeds.   Eyes: Negative for pain, discharge and redness.  Respiratory: Positive for cough and shortness of breath. Negative for sputum production, wheezing and stridor.   Cardiovascular: Negative for chest pain.  Gastrointestinal: Negative for abdominal pain, constipation, diarrhea, heartburn, nausea and vomiting.  Musculoskeletal: Negative for joint pain.  Skin: Negative for itching and rash.  Neurological: Negative for headaches.    All other systems negative unless noted above in HPI  Past medical/social/surgical/family history have been reviewed and are unchanged unless specifically indicated below.  No changes  Medication  List: Allergies as of 07/02/2018      Reactions   Septra [sulfamethoxazole-trimethoprim] Swelling   Amoxicillin Rash      Medication List        Accurate as of 07/02/18  4:24 PM. Always use your most recent med list.          albuterol (2.5 MG/3ML) 0.083% nebulizer solution Commonly known as:  PROVENTIL Take 3 mLs (2.5 mg total) by nebulization every 4 (four) hours as needed for wheezing or shortness of breath.   PROAIR RESPICLICK 423 (90 Base) MCG/ACT Aepb Generic drug:  Albuterol Sulfate INHALE 2 PUFFS INTO THE LUNGS EVERY 6 HOURS AS NEEDED.   aspirin 81 MG tablet Take 81 mg by mouth daily.   azelastine 0.1 % nasal spray Commonly known as:  ASTELIN INHALE 2 SPRAYS INTO EACH NOSTRIL TWICE A DAY AS DIRECTED   fexofenadine 180 MG tablet Commonly known as:  ALLEGRA Take 180 mg by mouth daily.   glucosamine-chondroitin 500-400 MG tablet Take 1 tablet by mouth 2 (two) times daily.   mometasone 50 MCG/ACT nasal spray Commonly known as:  NASONEX INHALE 2 SPRAYS INTO EACH NOSTRIL DAILY   montelukast 10 MG tablet Commonly known as:  SINGULAIR TAKE 1 TABLET (10 MG TOTAL) BY MOUTH AT BEDTIME.   moxifloxacin 400 MG tablet Commonly known as:  AVELOX Take 1 tablet (400 mg total) by mouth daily for 10 days.   MULTI-VITAMIN PO Take by mouth.   omeprazole 20 MG capsule Commonly known as:  PRILOSEC Take 20 mg by mouth daily.  QVAR REDIHALER 80 MCG/ACT inhaler Generic drug:  beclomethasone INHALE 2 PUFFS BY MOUTH 2 TIMES DAILY.   TRI-PREVIFEM 0.18/0.215/0.25 MG-35 MCG tablet Generic drug:  Norgestimate-Ethinyl Estradiol Triphasic       Known medication allergies: Allergies  Allergen Reactions  . Septra [Sulfamethoxazole-Trimethoprim] Swelling  . Amoxicillin Rash     Physical examination: Blood pressure 136/88, pulse 70, temperature 97.7 F (36.5 C), temperature source Tympanic, resp. rate 16, height 5\' 11"  (1.803 m), weight 268 lb (121.6 kg), SpO2 97  %.  General: Alert, interactive, in no acute distress. HEENT: PERRLA, TMs pearly gray, turbinates moderately edematous with clear discharge, post-pharynx non erythematous. Neck: Supple without lymphadenopathy. Lungs: Clear to auscultation without wheezing, rhonchi or rales. {no increased work of breathing. CV: Normal S1, S2 without murmurs. Abdomen: Nondistended, nontender. Skin: Warm and dry, without lesions or rashes. Extremities:  No clubbing, cyanosis or edema. Neuro:   Grossly intact.  Diagnositics/Labs:  Spirometry: FEV1: 3.28L 94%, FVC: 3.87L 88%, ratio consistent with nonobstructive pattern  Assessment and plan:   Acute sinusitis with lower respiratory infection  - take Avelox 400mg  daily x 10 days  - provided with prednisone pack to start in next 2 days if symptoms not improved with starting antibiotic  - recommend nasal saline spray and warm salt water gargles to help soothe throat.    Mild persistent asthma with exacerbation  - Use Qvar 80 mcg 2 puffs twice a day with spaces.    During flares/illnesses increase to 3 puffs three times a day.     - ProAir Respiclick 2 puffs every 4 hours as needed for cough, wheeze, shortness of breath, chest tightness.  Use either nebulizer or inhaler at a time.    - provided with prednisone pack as above   Allergic rhinoconjunctivitis  - Use Astepro 2 sprays twice daily.  - Continue Nasonex 2 sprays daily  - Continue Singulair each evening.  - Continue allegra 180mg    Follow-up 6 months or sooner if needed.  I appreciate the opportunity to take part in Regina Rice's care. Please do not hesitate to contact me with questions.  Sincerely,   Prudy Feeler, MD Allergy/Immunology [facility]

## 2018-07-02 NOTE — Patient Instructions (Addendum)
Acute sinusitis with lower respiratory infection  - take Avelox 400mg  daily x 10 days  - provided with prednisone pack to start in next 2 days if symptoms not improved with starting antibiotic  - recommend nasal saline spray and warm salt water gargles to help soothe throat.    Mild persistent asthma with exacerbation  - Use Qvar 80 mcg 2 puffs twice a day with spaces.    During flares/illnesses increase to 3 puffs three times a day.     - ProAir Respiclick 2 puffs every 4 hours as needed for cough, wheeze, shortness of breath, chest tightness.  Use either nebulizer or inhaler at a time.    - provided with prednisone pack as above   Allergic rhinoconjunctivitis  - Use Astepro 2 sprays twice daily.  - Continue Nasonex 2 sprays daily  - Continue Singulair each evening.  - Continue allegra 180mg    Follow-up 6 months or sooner if needed.

## 2018-07-07 ENCOUNTER — Other Ambulatory Visit: Payer: Self-pay | Admitting: Allergy

## 2018-07-07 DIAGNOSIS — J4531 Mild persistent asthma with (acute) exacerbation: Secondary | ICD-10-CM

## 2018-07-07 DIAGNOSIS — J309 Allergic rhinitis, unspecified: Secondary | ICD-10-CM

## 2018-07-07 DIAGNOSIS — H1010 Acute atopic conjunctivitis, unspecified eye: Secondary | ICD-10-CM

## 2018-07-07 MED FILL — AZELASTINE HCL 137 MCG/SPRA: 137 | 25 days supply | Qty: 30 | Fill #3

## 2018-07-07 MED FILL — MONTELUKAST SOD 10 MG TAB: 10 | 30 days supply | Qty: 30 | Fill #0

## 2018-07-07 MED FILL — MOMETASONE FUROATE 50 MCG S: 50 | 30 days supply | Qty: 17 | Fill #3

## 2018-07-19 MED FILL — TRI FEMYNOR 28 TABLET: 0.18/0.215/ | 84 days supply | Qty: 84 | Fill #0

## 2018-07-20 ENCOUNTER — Other Ambulatory Visit: Payer: Self-pay | Admitting: Allergy

## 2018-07-20 MED FILL — ALBUTEROL 0.083% INHAL SOLN: (2.5 MG/3ML | 30 days supply | Qty: 150 | Fill #1

## 2018-07-20 MED FILL — QVAR REDIHALER 80 MCG/ACT A: 80 | 30 days supply | Qty: 11 | Fill #0

## 2018-08-23 ENCOUNTER — Other Ambulatory Visit: Payer: Self-pay | Admitting: Allergy

## 2018-08-23 MED FILL — MONTELUKAST SOD 10 MG TAB: 10 | 30 days supply | Qty: 30 | Fill #1

## 2018-08-23 MED FILL — AZELASTINE HCL 137 MCG/SPRA: 137 | 25 days supply | Qty: 30 | Fill #0

## 2018-08-23 MED FILL — MOMETASONE FUROATE 50 MCG S: 50 | 30 days supply | Qty: 17 | Fill #0

## 2018-08-23 NOTE — Telephone Encounter (Signed)
OK refill azelastine and mometasone.  Last OV 07/02/18.  Per chart, Follow-up 6 months or sooner if needed.

## 2018-08-24 MED FILL — CHLORHEXIDINE 0.12% RINSE: 0.12 | 30 days supply | Qty: 946 | Fill #0

## 2018-09-20 MED FILL — MOMETASONE FUROATE 50 MCG S: 50 | 30 days supply | Qty: 17 | Fill #1

## 2018-09-20 MED FILL — MONTELUKAST SOD 10 MG TAB: 10 | 30 days supply | Qty: 30 | Fill #2

## 2018-09-20 MED FILL — AZELASTINE HCL 137 MCG/SPRA: 137 | 25 days supply | Qty: 30 | Fill #1

## 2018-09-20 MED FILL — QVAR REDIHALER 80 MCG/ACT A: 80 | 30 days supply | Qty: 11 | Fill #1

## 2018-10-22 MED FILL — MONTELUKAST SOD 10 MG TAB: 10 | 30 days supply | Qty: 30 | Fill #3

## 2018-10-22 MED FILL — TRI FEMYNOR 28 TABLET: 0.18/0.215/ | 84 days supply | Qty: 84 | Fill #1

## 2018-10-22 MED FILL — AZELASTINE HCL 137 MCG SPRY: 0.1 | 25 days supply | Qty: 30 | Fill #2

## 2018-10-22 MED FILL — MOMETASONE FUROATE 50 MCG S: 50 | 30 days supply | Qty: 17 | Fill #2

## 2018-12-06 MED FILL — QVAR REDIHALER 80 MCG/ACT A: 80 | 30 days supply | Qty: 11 | Fill #2

## 2018-12-06 MED FILL — MOMETASONE FUROATE 50 MCG S: 50 | 30 days supply | Qty: 17 | Fill #3

## 2018-12-06 MED FILL — AZELASTINE HCL 137 MCG SPRY: 0.1 | 25 days supply | Qty: 30 | Fill #3

## 2018-12-06 MED FILL — MONTELUKAST SOD 10 MG TAB: 10 | 30 days supply | Qty: 30 | Fill #4

## 2018-12-30 MED FILL — MONTELUKAST SOD 10 MG TAB: 10 | 30 days supply | Qty: 30 | Fill #5

## 2018-12-31 ENCOUNTER — Encounter: Payer: Self-pay | Admitting: Allergy

## 2018-12-31 ENCOUNTER — Ambulatory Visit: Payer: 59 | Admitting: Allergy

## 2018-12-31 VITALS — BP 128/78 | HR 70 | Resp 16

## 2018-12-31 DIAGNOSIS — K219 Gastro-esophageal reflux disease without esophagitis: Secondary | ICD-10-CM

## 2018-12-31 DIAGNOSIS — H1010 Acute atopic conjunctivitis, unspecified eye: Secondary | ICD-10-CM | POA: Diagnosis not present

## 2018-12-31 DIAGNOSIS — J4531 Mild persistent asthma with (acute) exacerbation: Secondary | ICD-10-CM

## 2018-12-31 DIAGNOSIS — J309 Allergic rhinitis, unspecified: Secondary | ICD-10-CM | POA: Diagnosis not present

## 2018-12-31 MED ORDER — BECLOMETHASONE DIPROP HFA 80 MCG/ACT IN AERB: INHALATION_SPRAY | RESPIRATORY_TRACT | 5 refills | Status: DC

## 2018-12-31 MED ORDER — MONTELUKAST SODIUM 10 MG PO TABS: 10.0000 mg | ORAL_TABLET | Freq: Every day | ORAL | 2 refills | Status: DC

## 2018-12-31 MED ORDER — MOMETASONE FUROATE 50 MCG/ACT NA SUSP: NASAL | 3 refills | Status: DC

## 2018-12-31 MED FILL — TRI FEMYNOR 28 TABLET: 0.18/0.215/ | 84 days supply | Qty: 84 | Fill #0

## 2018-12-31 MED FILL — QVAR REDIHALER 80 MCG/ACT A: 80 | 30 days supply | Qty: 11 | Fill #0 | Status: TO

## 2018-12-31 MED FILL — MOMETASONE FUROATE 50 MCG S: 50 | 30 days supply | Qty: 17 | Fill #0 | Status: TO

## 2018-12-31 NOTE — Patient Instructions (Addendum)
Mild persistent asthma with exacerbation  - Use Qvar 80 mcg 2 puffs twice a day to prevent cough and wheeze.   During flares/illnesses increase to 3 puffs three times a day.     - Prednisone 10 mg tablets. Take 2 tablets twice a day for the next 3 days, then take 2 tablets once a day for 1 day, then take 1 tablet on the 5th day     - ProAir Respiclick 2 puffs every 4 hours as needed for cough, wheeze, shortness of breath, chest tightness.  Use either nebulizer or inhaler at a time.    - provided with prednisone pack as above   Allergic rhinoconjunctivitis  - Use Astepro 2 sprays twice daily.  - Continue Nasonex 2 sprays daily  - Continue Singulair each evening.  - Continue allegra 180mg    - Begin Mucinex [phone] mg once a day  - Consider nasal saline rinses once a day as needed for nasal symptoms. Use this before medicated nasal sprays  Reflux  - Begin famotidine 20 mg twice a day to decrease reflux  - Reduce all forms of caffeine including soda and chocolate  Call the clinic if this treatment plan is not working well for you  Follow-up 6 months or sooner if needed.

## 2018-12-31 NOTE — Progress Notes (Signed)
Follow-up Note  RE: Regina Rice MRN: [id number] DOB: Nov 25, 1965 Date of Office Visit: 12/31/2018   History of present illness: Regina Rice is a 53 y.o. female presenting today for follow-up of asthma, allergic rhinitis, and reflux. She reports that she has started to experience a chest tightness, shortness of breath, wheezing at night, and cough which is mostly dry and occasionally producing clear mucus. She reports that she is using Qvar 80- 2 puffs twice a day, montelukast 10 mg once a day, and infrequently using her albuterol inhaler or nebulizer. Allergic rhinitis is reported as not well controlled with nasal congestion and occasional sinus headache. She continues Nasonex, azelastine and takes Allegra once a day. She reports reflux that is not controlled with omeprazole 20 mg a few times a week. Her current medications are listed in the chart.   Review of systems: Review of Systems  Constitutional: Negative.  Negative for fever.  HENT: Positive for congestion.        Thick post nasal drainage.   Eyes: Negative.   Respiratory: Positive for shortness of breath and wheezing.   Cardiovascular: Negative.   Gastrointestinal: Positive for heartburn.  Genitourinary: Negative.   Musculoskeletal: Negative.   Skin: Negative.   Neurological: Negative.   Endo/Heme/Allergies: Negative.   Psychiatric/Behavioral: Negative.     All other systems negative unless noted above in HPI  Past medical/social/surgical/family history have been reviewed and are unchanged unless specifically indicated below.  No changes  Medication List: Allergies as of 12/31/2018      Reactions   Septra [sulfamethoxazole-trimethoprim] Swelling   Amoxicillin Rash      Medication List       Accurate as of December 31, 2018 12:53 PM. Always use your most recent med list.        albuterol (2.5 MG/3ML) 0.083% nebulizer solution Commonly known as:  PROVENTIL Take 3 mLs (2.5 mg total) by nebulization every 4  (four) hours as needed for wheezing or shortness of breath.   ProAir RespiClick 798 (90 Base) MCG/ACT Aepb Generic drug:  Albuterol Sulfate INHALE 2 PUFFS INTO THE LUNGS EVERY 6 HOURS AS NEEDED.   aspirin 81 MG tablet Take 81 mg by mouth daily.   Azelastine HCl 137 MCG/SPRAY Soln INHALE 2 SPRAYS INTO EACH NOSTRIL TWICE A DAY AS DIRECTED   fexofenadine 180 MG tablet Commonly known as:  ALLEGRA Take 180 mg by mouth daily.   glucosamine-chondroitin 500-400 MG tablet Take 1 tablet by mouth 2 (two) times daily.   mometasone 50 MCG/ACT nasal spray Commonly known as:  NASONEX INHALE 2 SPRAYS INTO EACH NOSTRIL DAILY   montelukast 10 MG tablet Commonly known as:  SINGULAIR TAKE 1 TABLET (10 MG TOTAL) BY MOUTH AT BEDTIME.   MULTI-VITAMIN PO Take by mouth.   omeprazole 20 MG capsule Commonly known as:  PRILOSEC Take 20 mg by mouth daily.   Qvar RediHaler 80 MCG/ACT inhaler Generic drug:  beclomethasone INHALE 2 PUFFS BY MOUTH 2 TIMES DAILY.   Tri-Previfem 0.18/0.215/0.25 MG-35 MCG tablet Generic drug:  Norgestimate-Ethinyl Estradiol Triphasic       Known medication allergies: Allergies  Allergen Reactions  . Septra [Sulfamethoxazole-Trimethoprim] Swelling  . Amoxicillin Rash     Physical examination: Blood pressure 128/78, pulse 70, resp. rate 16.  General: Alert, interactive, in no acute distress. HEENT: TMs pearly gray, turbinates minimally edematous without discharge, post-pharynx mildly erythematous. Neck: Supple without lymphadenopathy. Lungs: Clear to auscultation without wheezing, rhonchi or rales. {no increased work of breathing.  CV: Normal S1, S2 without murmurs. Abdomen: Nondistended, nontender. Skin: Warm and dry, without lesions or rashes. Extremities:  No clubbing, cyanosis or edema. Neuro:   Grossly intact.  Diagnositics/Labs:  Spirometry: FEV1: 3.47, FVC: 4.01, ratio consistent with normal ventilatory function   Assessment and plan:   Mild  persistent asthma with exacerbation  - Use Qvar 80 mcg 2 puffs twice a day to prevent cough and wheeze.   During flares/illnesses increase to 3 puffs three times a day.    - Prednisone 10 mg tablets. Take 2 tablets twice a day for the next 3 days, then take 2 tablets once a day for 1 day, then take 1 tablet on the 5th day     - ProAir Respiclick 2 puffs every 4 hours as needed for cough, wheeze, shortness of breath, chest tightness.  Use either nebulizer or inhaler at a time.    - provided with prednisone pack as above   Allergic rhinoconjunctivitis  - Use Astepro 2 sprays twice daily.  - Continue Nasonex 2 sprays daily  - Continue Singulair each evening.  - Continue allegra 180mg    - Begin Mucinex [phone] mg once a day  - Consider nasal saline rinses once a day as needed for nasal symptoms. Use this before medicated nasal sprays  Reflux  - Begin famotidine 20 mg twice a day to decrease reflux  - Reduce all forms of caffeine including soda and chocolate  Call the clinic if this treatment plan is not working well for you  Follow-up 6 months or sooner if needed.  Thank you for the opportunity to care for this patient.  Please do not hesitate to contact me with questions.  Gareth Morgan, FNP [facility]  Attestation: I reviewed the Nurse Practitioner's note and agree with the documented findings and plan of care. We discussed the patient and developed a plan concurrently.   I appreciate the opportunity to take part in Regina Rice's care. Please do not hesitate to contact me with questions.  Sincerely,   Prudy Feeler, MD Allergy/Immunology [facility]

## 2019-01-03 ENCOUNTER — Telehealth: Payer: Self-pay | Admitting: Allergy

## 2019-01-03 MED ORDER — FAMOTIDINE 20 MG PO TABS: 20.0000 mg | ORAL_TABLET | Freq: Two times a day (BID) | ORAL | 5 refills | Status: DC

## 2019-01-03 MED FILL — FAMOTIDINE 20 MG TABLET: 20 | 30 days supply | Qty: 60 | Fill #0 | Status: TO

## 2019-01-03 NOTE — Telephone Encounter (Signed)
Prescription for Famotidine 20mg  twice daily was sent in to [facility]

## 2019-01-03 NOTE — Telephone Encounter (Signed)
Patient was to have a script for famotidine sent in to the [facility] - was seen last week and meds weren't sent in

## 2019-01-04 ENCOUNTER — Other Ambulatory Visit: Payer: Self-pay | Admitting: Allergy

## 2019-01-04 MED FILL — AZELASTINE HCL 137 MCG SPRY: 0.1 | 25 days supply | Qty: 30 | Fill #0 | Status: TO

## 2019-01-31 MED FILL — MOMETASONE FUROATE 50 MCG S: 50 | 30 days supply | Qty: 17 | Fill #0

## 2019-01-31 MED FILL — MONTELUKAST SOD 10 MG TAB: 10 | 90 days supply | Qty: 90 | Fill #0

## 2019-01-31 MED FILL — AZELASTINE HCL 137 MCG SPRY: 0.1 | 25 days supply | Qty: 30 | Fill #0

## 2019-02-10 NOTE — Addendum Note (Signed)
Addended by: Lytle Michaels A on: 02/10/2019 04:37 PM   Modules accepted: Orders

## 2019-02-15 MED FILL — NAPROXEN 500 MG TABLET: 500 | 30 days supply | Qty: 60 | Fill #0

## 2019-02-19 MED FILL — AZELASTINE HCL 137 MCG SPRY: 0.1 | 25 days supply | Qty: 30 | Fill #1

## 2019-02-24 MED FILL — MOMETASONE FUROATE 50 MCG S: 50 | 30 days supply | Qty: 17 | Fill #1

## 2019-02-28 MED FILL — QVAR REDIHALER 80 MCG/ACT A: 80 | 30 days supply | Qty: 11 | Fill #0

## 2019-03-11 MED FILL — NAPROXEN 500 MG TABLET: 500 | 30 days supply | Qty: 60 | Fill #0

## 2019-03-16 MED FILL — AZELASTINE HCL 137 MCG SPRY: 0.1 | 25 days supply | Qty: 30 | Fill #2

## 2019-03-24 MED FILL — QVAR REDIHALER 80 MCG/ACT A: 80 | 30 days supply | Qty: 11 | Fill #1

## 2019-03-26 MED FILL — MOMETASONE FUROATE 50 MCG S: 50 | 30 days supply | Qty: 17 | Fill #2

## 2019-04-01 ENCOUNTER — Other Ambulatory Visit: Payer: Self-pay | Admitting: Obstetrics and Gynecology

## 2019-04-01 ENCOUNTER — Other Ambulatory Visit (HOSPITAL_COMMUNITY)
Admission: RE | Admit: 2019-04-01 | Discharge: 2019-04-01 | Disposition: A | Discharge status: Home / Self Care | Payer: 59 | Source: Ambulatory Visit | Attending: Obstetrics and Gynecology | Admitting: Obstetrics and Gynecology

## 2019-04-01 DIAGNOSIS — L918 Other hypertrophic disorders of the skin: Secondary | ICD-10-CM | POA: Diagnosis not present

## 2019-04-01 DIAGNOSIS — Z01419 Encounter for gynecological examination (general) (routine) without abnormal findings: Secondary | ICD-10-CM | POA: Insufficient documentation

## 2019-04-01 DIAGNOSIS — I10 Essential (primary) hypertension: Secondary | ICD-10-CM | POA: Diagnosis not present

## 2019-04-01 DIAGNOSIS — Z3041 Encounter for surveillance of contraceptive pills: Secondary | ICD-10-CM | POA: Diagnosis not present

## 2019-04-01 DIAGNOSIS — M7752 Other enthesopathy of left foot: Secondary | ICD-10-CM | POA: Diagnosis not present

## 2019-04-01 MED FILL — TRI FEMYNOR 28 TABLET: 0.18/0.215/ | 84 days supply | Qty: 84 | Fill #0

## 2019-04-01 MED FILL — LISINOPRIL 10 MG TABS: 10 | 30 days supply | Qty: 30 | Fill #0

## 2019-04-04 MED FILL — NAPROXEN 500 MG TABLET: 500 | 30 days supply | Qty: 60 | Fill #1

## 2019-04-05 DIAGNOSIS — M25572 Pain in left ankle and joints of left foot: Secondary | ICD-10-CM | POA: Diagnosis not present

## 2019-04-05 LAB — CYTOLOGY - PAP
Diagnosis: NEGATIVE
HPV: NOT DETECTED

## 2019-04-05 MED FILL — NITROGLYCERIN 0.2 MG/HR PTC: 0.2 | 30 days supply | Qty: 30 | Fill #0

## 2019-04-11 MED FILL — AZELASTINE HCL 137 MCG SPRY: 0.1 | 25 days supply | Qty: 30 | Fill #3

## 2019-04-23 IMAGING — MG DIGITAL SCREENING BILATERAL MAMMOGRAM WITH TOMO AND CAD
6 of 10 series · 6 of 30 positions shown · non-contrast
Comparison: Previous exam(s).

CLINICAL DATA: Screening.

EXAM:
DIGITAL SCREENING BILATERAL MAMMOGRAM WITH TOMO AND CAD

[L MLO synth-2D]
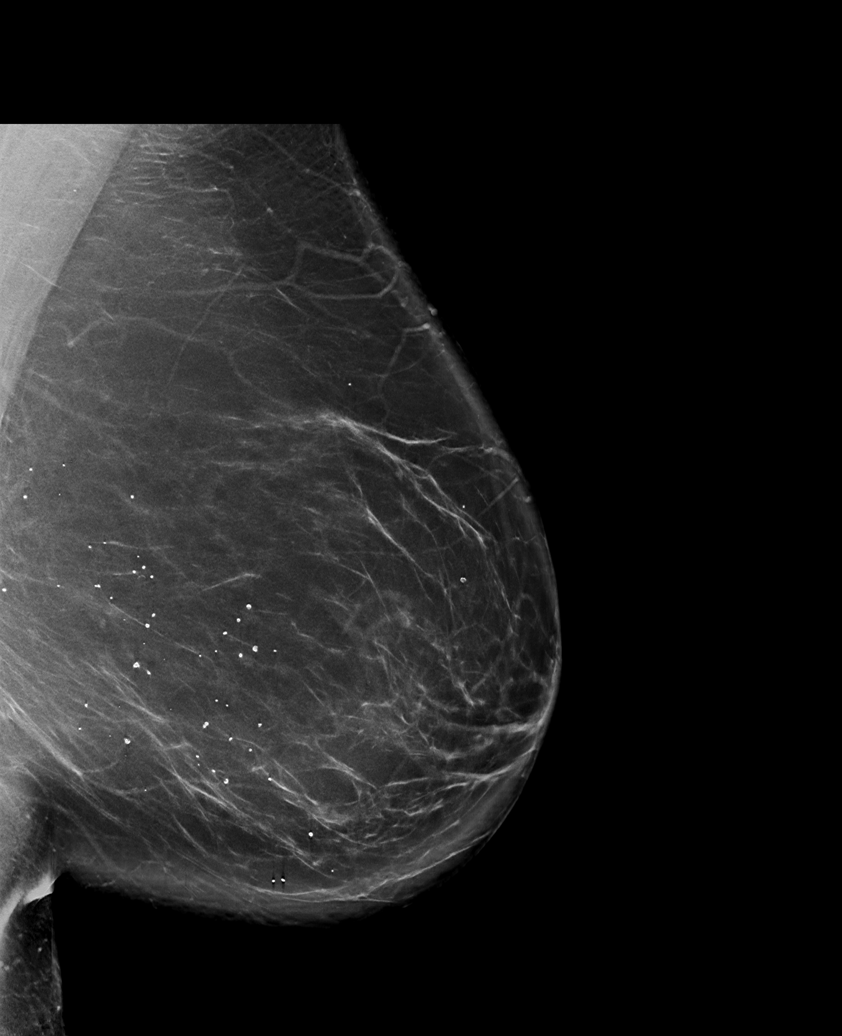

[R MLO synth-2D (1 of 2)]
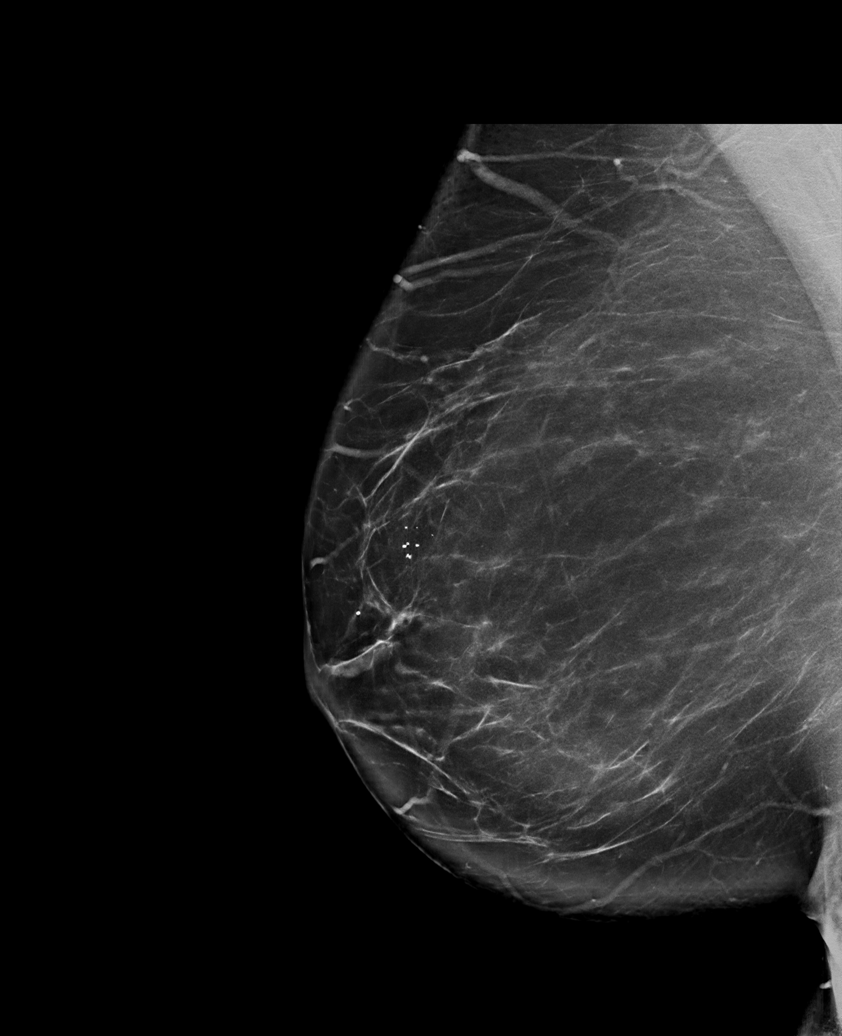

[R CC synth-2D]
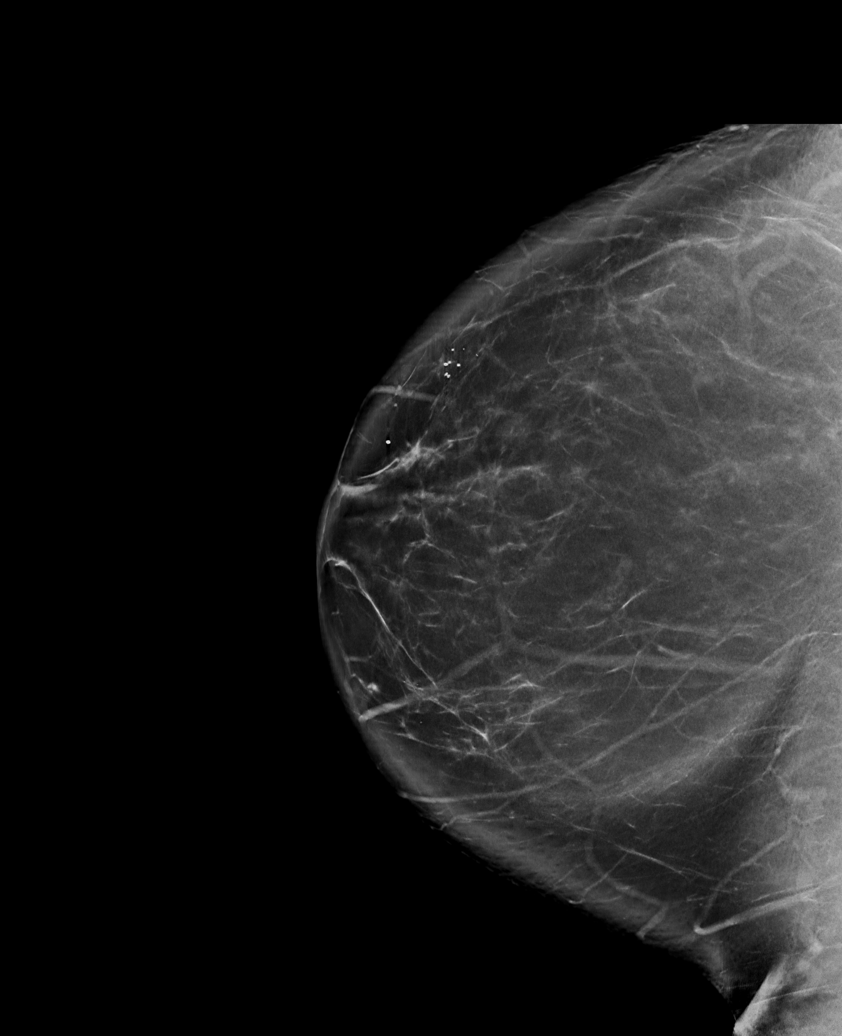

[R MLO synth-2D (2 of 2)]
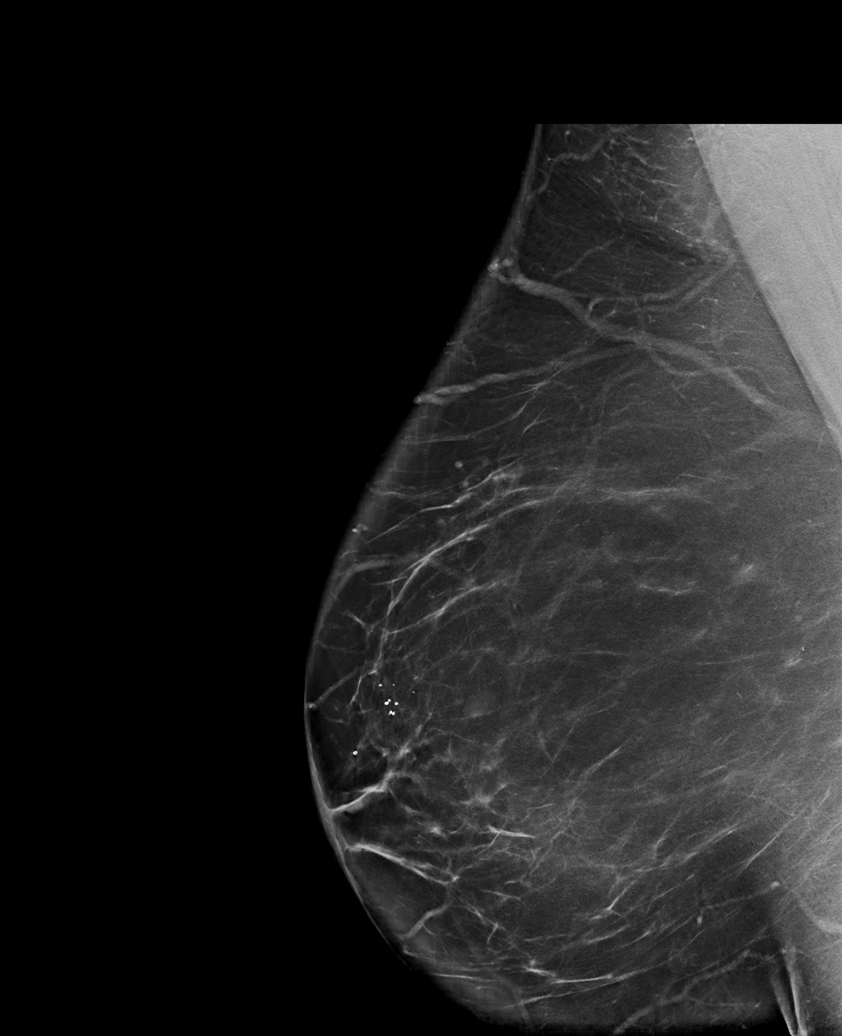

[L CC synth-2D]
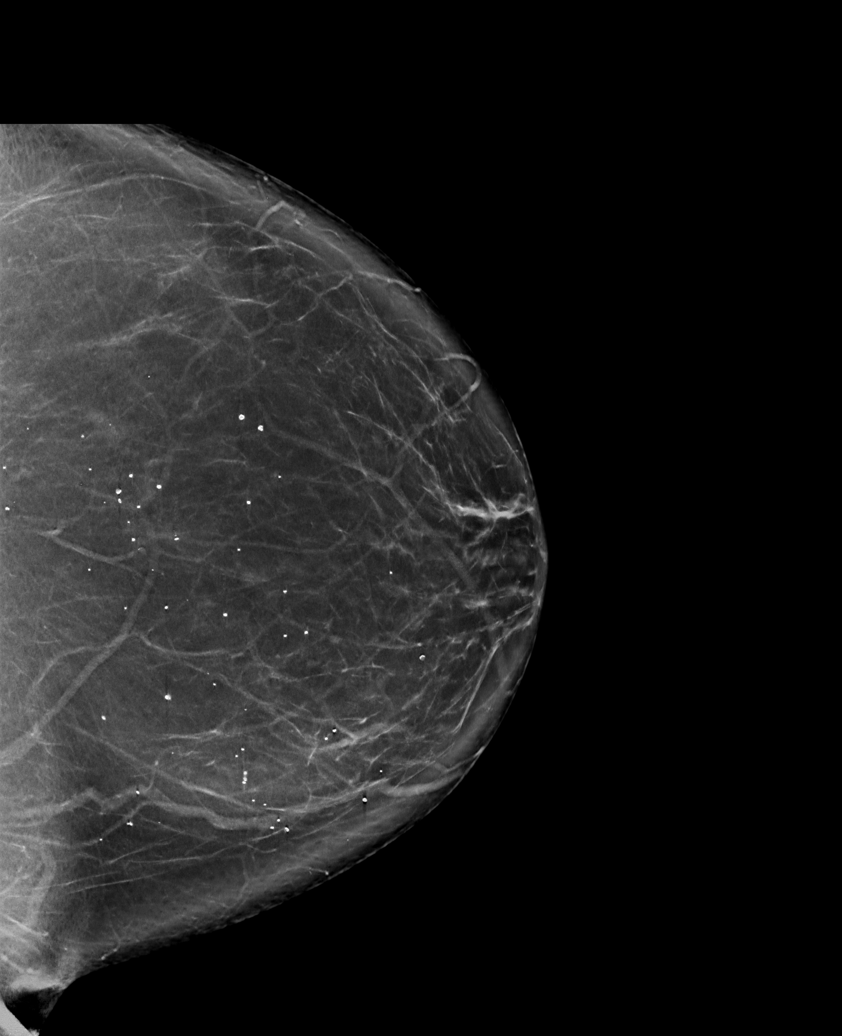

[L MLO tomo · tomo slice 57/113.0]
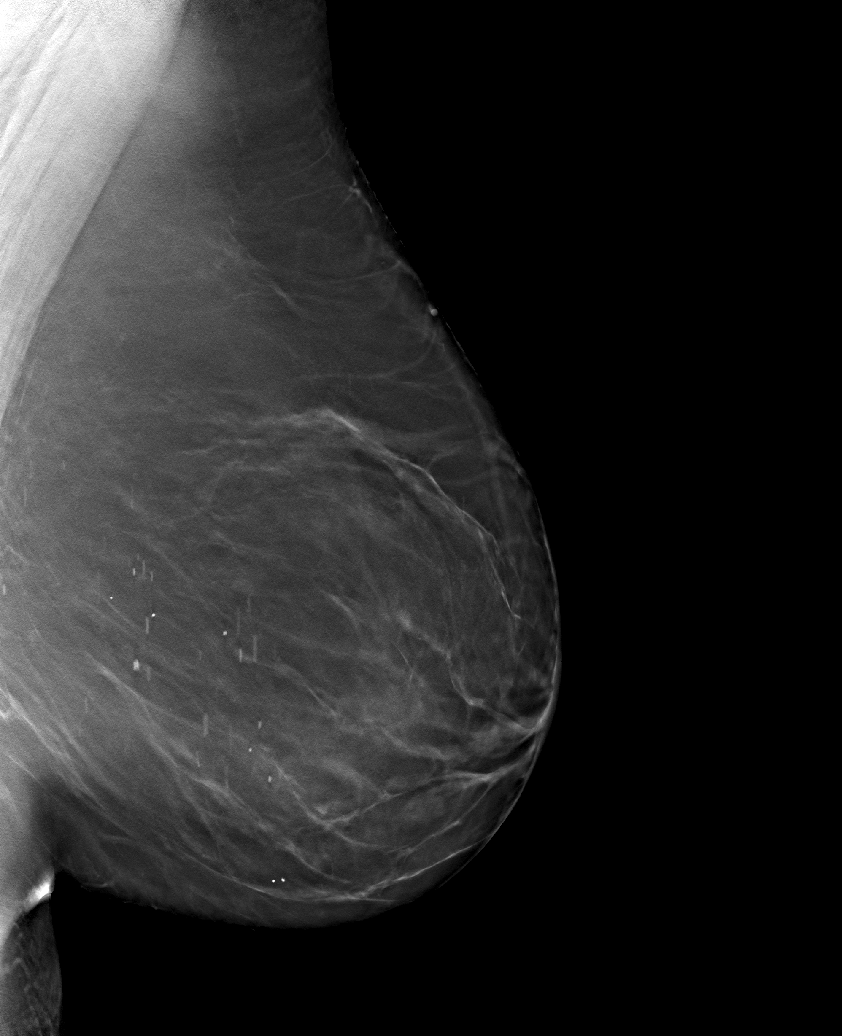

[6 of 30 positions shown; findings below may reference images not displayed]

ACR Breast Density Category b: There are scattered areas of
fibroglandular density.
FINDINGS: In the right breast, a possible mass warrants further evaluation. In
the left breast, no findings suspicious for malignancy. Images were
processed with CAD.
IMPRESSION: Further evaluation is suggested for possible mass in the right
breast.

RECOMMENDATION:
Diagnostic mammogram and possibly ultrasound of the right breast.
(Code:T1-A-550)

The patient will be contacted regarding the findings, and additional
imaging will be scheduled.

BI-RADS CATEGORY  0: Incomplete. Need additional imaging evaluation
and/or prior mammograms for comparison.

## 2019-04-25 ENCOUNTER — Other Ambulatory Visit: Payer: Self-pay | Admitting: Family Medicine

## 2019-04-25 MED FILL — MONTELUKAST SOD 10 MG TAB: 10 | 30 days supply | Qty: 30 | Fill #0

## 2019-04-26 IMAGING — MG DIGITAL DIAGNOSTIC UNILATERAL RIGHT MAMMOGRAM WITH TOMO AND CAD
4 series · 4 of 12 positions shown · non-contrast
Comparison: Previous exam(s).

CLINICAL DATA: Screening recall for a possible mass in the right
breast.

EXAM:
DIGITAL DIAGNOSTIC RIGHT MAMMOGRAM WITH CAD AND TOMO
ULTRASOUND RIGHT BREAST

[R CC synth-2D]
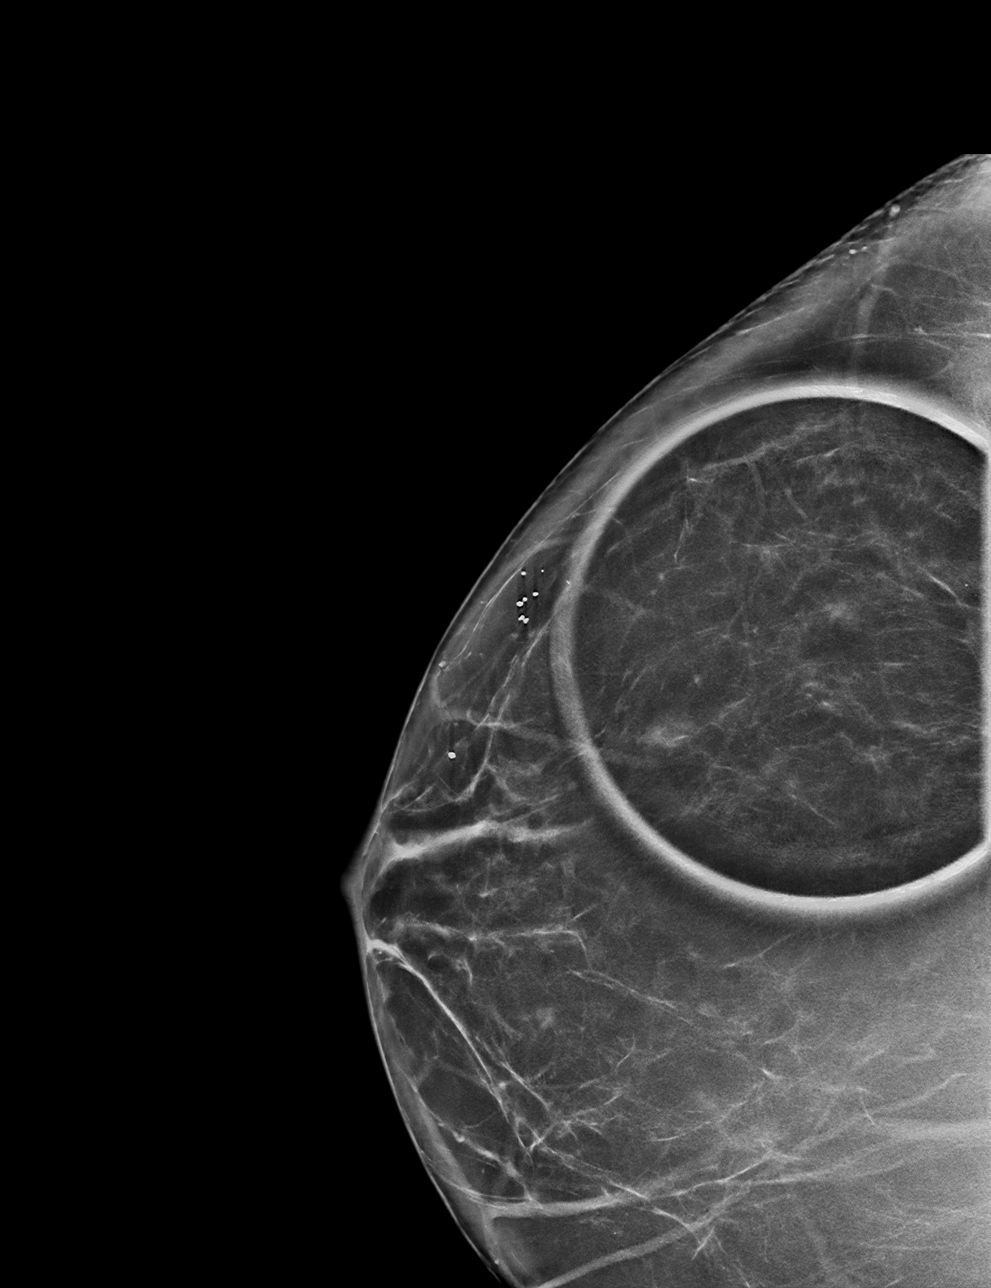

[R MLO synth-2D]
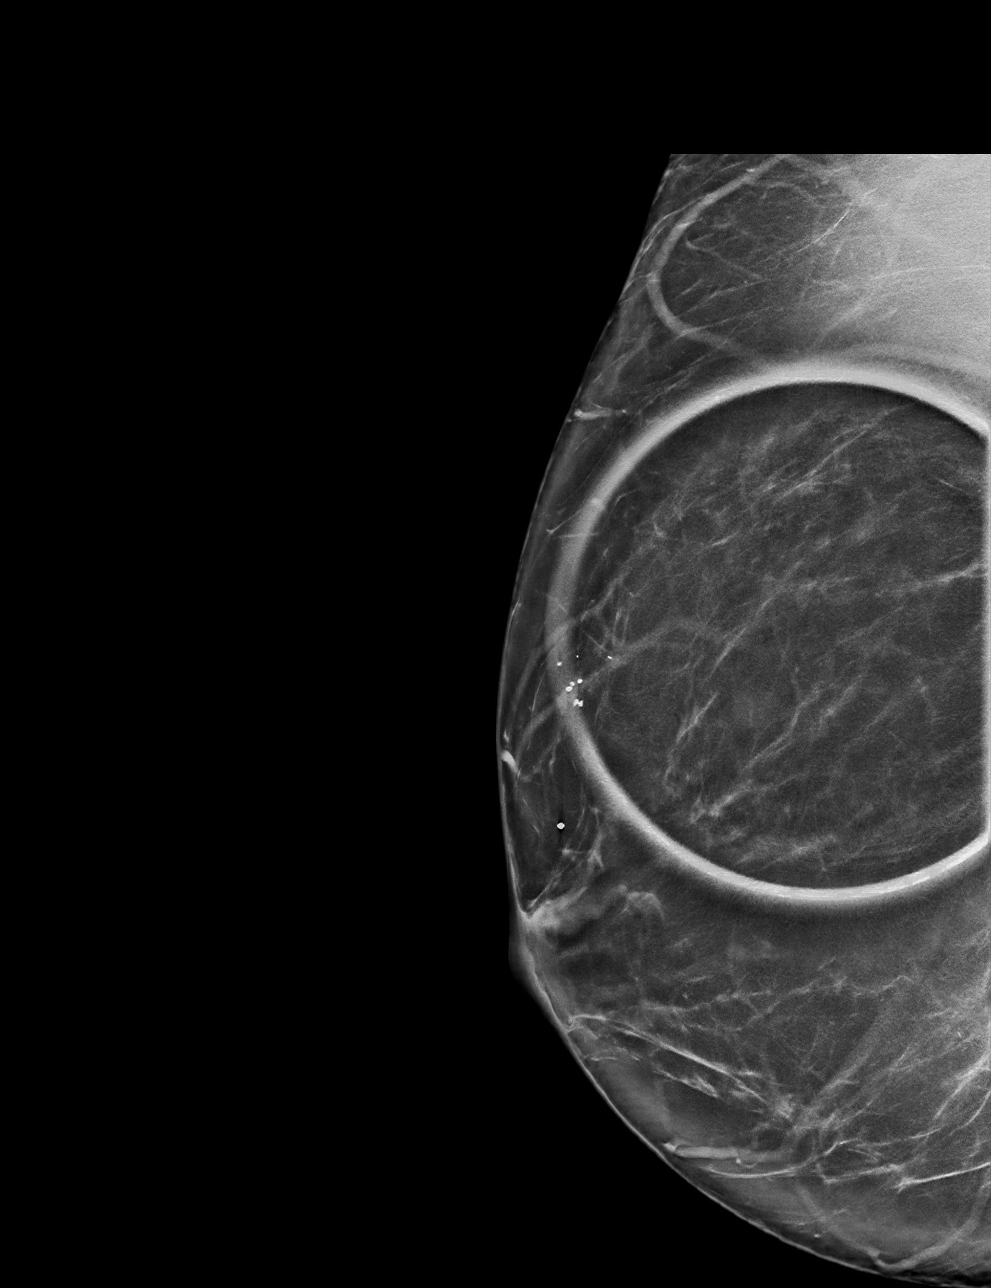

[R MLO tomo · tomo slice 49/97.0]
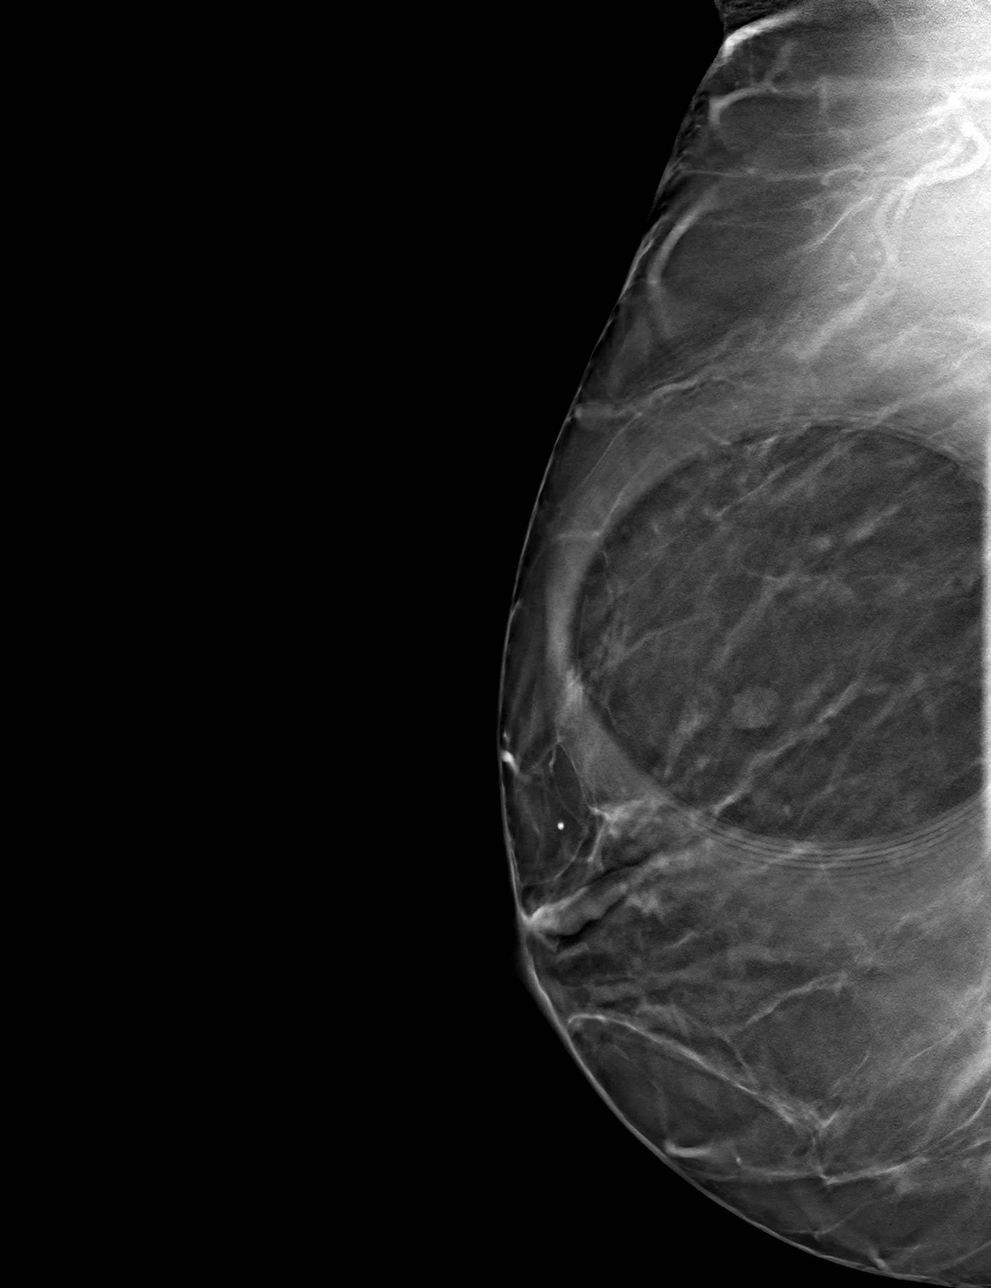

[R CC tomo · tomo slice 45/89.0]
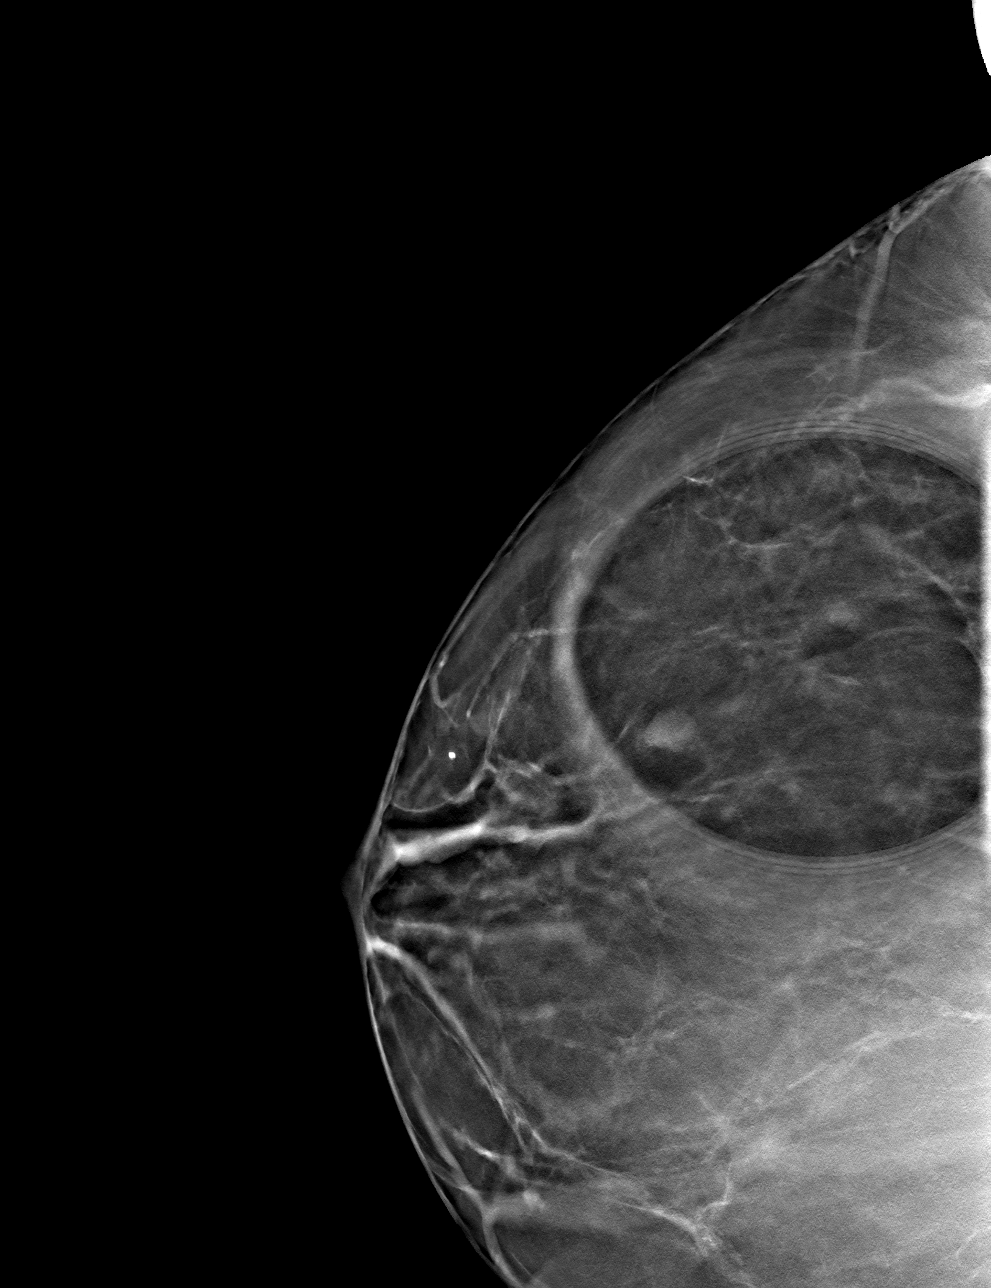

[4 of 12 positions shown; findings below may reference images not displayed]

ACR Breast Density Category b: There are scattered areas of
fibroglandular density.
FINDINGS: Possible masses right breast, upper outer quadrant, middle depth,
persists on the diagnostic images. It is oval and circumscribed and
relatively lucent.

Mammographic images were processed with CAD.

On physical exam, no mass is palpated the upper outer quadrant of
the right breast.

Targeted ultrasound is performed, showing a cyst in the right breast
at 9:30 o'clock, 3 cm the nipple, measuring 5 x 3 x 4 mm, consistent
in size, shape and location to the mammographic abnormality. No
other masses are noted in the right breast laterally or upper outer
quadrant.
IMPRESSION: 1. No evidence of breast malignancy.
2. Small benign right breast cyst.

RECOMMENDATION:
Screening mammogram in one year.(Code:03-L-SD3)

I have discussed the findings and recommendations with the patient.
Results were also provided in writing at the conclusion of the
visit. If applicable, a reminder letter will be sent to the patient
regarding the next appointment.

BI-RADS CATEGORY  2: Benign.

## 2019-04-26 MED FILL — LISINOPRIL 10 MG TABLET: 10 | 90 days supply | Qty: 90 | Fill #0

## 2019-04-29 ENCOUNTER — Other Ambulatory Visit: Payer: Self-pay | Admitting: Allergy and Immunology

## 2019-04-29 DIAGNOSIS — J4530 Mild persistent asthma, uncomplicated: Secondary | ICD-10-CM

## 2019-05-02 DIAGNOSIS — H5213 Myopia, bilateral: Secondary | ICD-10-CM | POA: Diagnosis not present

## 2019-05-02 DIAGNOSIS — H524 Presbyopia: Secondary | ICD-10-CM | POA: Diagnosis not present

## 2019-05-02 DIAGNOSIS — H52203 Unspecified astigmatism, bilateral: Secondary | ICD-10-CM | POA: Diagnosis not present

## 2019-05-02 DIAGNOSIS — I10 Essential (primary) hypertension: Secondary | ICD-10-CM | POA: Diagnosis not present

## 2019-05-02 MED FILL — PROAIR RESPICLICK INHAL PWD: 108 (90 BAS | 25 days supply | Qty: 1 | Fill #0

## 2019-05-02 NOTE — Telephone Encounter (Signed)
Pt is requesting refill for Proair sent to [facility] on [address]

## 2019-05-19 MED FILL — MONTELUKAST SOD 10 MG TAB: 10 | 60 days supply | Qty: 60 | Fill #1

## 2019-05-20 ENCOUNTER — Other Ambulatory Visit: Payer: Self-pay | Admitting: Family Medicine

## 2019-05-20 MED FILL — QVAR REDIHALER 80 MCG/ACT A: 80 | 30 days supply | Qty: 11 | Fill #2

## 2019-05-20 MED FILL — MOMETASONE FUROATE 50 MCG S: 50 | 30 days supply | Qty: 17 | Fill #0

## 2019-06-24 MED FILL — AZELASTINE HCL 137 MCG SPRY: 0.1 | 25 days supply | Qty: 30 | Fill #4

## 2019-06-24 MED FILL — TRI FEMYNOR 28 TABLET: 0.18/0.215/ | 84 days supply | Qty: 84 | Fill #1

## 2019-06-24 MED FILL — MOMETASONE FUROATE 50 MCG S: 50 | 30 days supply | Qty: 17 | Fill #1

## 2019-07-06 ENCOUNTER — Ambulatory Visit: Payer: 59 | Admitting: Allergy

## 2019-07-07 ENCOUNTER — Encounter: Payer: Self-pay | Admitting: Allergy

## 2019-07-07 ENCOUNTER — Ambulatory Visit: Payer: 59 | Admitting: Allergy

## 2019-07-07 ENCOUNTER — Other Ambulatory Visit: Payer: Self-pay

## 2019-07-07 VITALS — BP 142/90 | HR 75 | Temp 97.3°F | Resp 16 | Ht 69.69 in | Wt 265.8 lb

## 2019-07-07 DIAGNOSIS — K219 Gastro-esophageal reflux disease without esophagitis: Secondary | ICD-10-CM

## 2019-07-07 DIAGNOSIS — J4530 Mild persistent asthma, uncomplicated: Secondary | ICD-10-CM | POA: Diagnosis not present

## 2019-07-07 DIAGNOSIS — J3089 Other allergic rhinitis: Secondary | ICD-10-CM | POA: Diagnosis not present

## 2019-07-07 DIAGNOSIS — H1013 Acute atopic conjunctivitis, bilateral: Secondary | ICD-10-CM | POA: Diagnosis not present

## 2019-07-07 NOTE — Patient Instructions (Addendum)
Mild persistent asthma   - doing well at this time  - Use Qvar 80 mcg 2 puffs twice a day to prevent cough and wheeze.    During flares/illnesses increase Qvar to 3 puffs three times a day.     - ProAir Respiclick 2 puffs every 4 hours as needed for cough, wheeze, shortness of breath, chest tightness.  Use either nebulizer or inhaler at a time.    Allergic rhinoconjunctivitis  - Use Astepro 2 sprays twice daily.  - Continue Nasonex 2 sprays daily in AM  - Continue Singulair each evening.  - Continue allegra 180mg  in PM  - nasal saline rinses once a day at night.  Use this before medicated nasal sprays.    Reflux  - continue famotidine 20 mg twice a day to decrease reflux  Call the clinic if this treatment plan is not working well for you  Follow-up 6-9 months or sooner if needed.

## 2019-07-07 NOTE — Progress Notes (Signed)
Follow-up Note  RE: TOTIANA Rice MRN: [id number] DOB: 03/18/1966 Date of Office Visit: 07/07/2019   History of present illness: Regina Rice is a 53 y.o. female presenting today for follow-up of asthma, allergic rhinitis with conjunctivitis, reflux.  She was last seen in the office on 12/31/2018 by our nurse practitioner Gareth Morgan and myself.  At that visit she was treated with asthma exacerbation with prednisone.  She states this resolved the exacerbation and she has been doing well since.  She states she only has needed to use her albuterol on hot/humid days like when she helped to move her son into college dorm that had no elevator or A/C.  She denies nighttime awakenings.  She is using Qvar 70mcg 2 puffs twice a day and singulair daily.  Denies any ED/UC visits or any further systemic steroids since last visit.   She does states she has some congestion in the morning that seems to loosen up after she showers.  She is taking Astepro twice a day and nasonex at night along with allegra.   She states she was not able to find famotidine anywhere for about 3 months after last visit.  She did find some about 2-3 months ago and was taking one a day to stretch it out.  She recently started taking it twice a day and does feel this helps manage her reflux much better than once a day dosing.     Review of systems: Review of Systems  Constitutional: Negative for chills, fever and malaise/fatigue.  HENT: Positive for congestion. Negative for ear discharge, ear pain, nosebleeds, sinus pain and sore throat.   Eyes: Negative for pain, discharge and redness.  Respiratory: Negative for cough, shortness of breath and wheezing.   Cardiovascular: Negative for chest pain.  Gastrointestinal: Positive for heartburn. Negative for abdominal pain, constipation, diarrhea, nausea and vomiting.  Musculoskeletal: Negative for joint pain.  Skin: Negative for itching and rash.  Neurological: Negative for headaches.    All other systems negative unless noted above in HPI  Past medical/social/surgical/family history have been reviewed and are unchanged unless specifically indicated below.  No changes  Medication List: Allergies as of 07/07/2019      Reactions   Septra [sulfamethoxazole-trimethoprim] Swelling   Amoxicillin Rash      Medication List       Accurate as of July 07, 2019  5:57 PM. If you have any questions, ask your nurse or doctor.        STOP taking these medications   omeprazole 20 MG capsule Commonly known as: PRILOSEC Stopped by:  Charmian Muff, MD     TAKE these medications   albuterol (2.5 MG/3ML) 0.083% nebulizer solution Commonly known as: PROVENTIL Take 3 mLs (2.5 mg total) by nebulization every 4 (four) hours as needed for wheezing or shortness of breath.   ProAir RespiClick [id number] (90 Base) MCG/ACT Aepb Generic drug: Albuterol Sulfate INHALE 2 PUFFS BY MOUTH INTO THE LUNGS EVERY 6 HOURS AS NEEDED.   aspirin 81 MG tablet Take 81 mg by mouth daily.   azelastine 0.1 % nasal spray Commonly known as: ASTELIN INHALE 2 SPRAYS INTO EACH NOSTRIL TWICE A DAY AS DIRECTED   beclomethasone 80 MCG/ACT inhaler Commonly known as: Qvar RediHaler INHALE 2 PUFFS BY MOUTH 2 TIMES DAILY.   famotidine 20 MG tablet Commonly known as: PEPCID Take 1 tablet (20 mg total) by mouth 2 (two) times daily.   fexofenadine 180 MG tablet Commonly known as: ALLEGRA  Take 180 mg by mouth daily.   glucosamine-chondroitin 500-400 MG tablet Take 1 tablet by mouth 2 (two) times daily.   lisinopril 10 MG tablet Commonly known as: ZESTRIL   mometasone 50 MCG/ACT nasal spray Commonly known as: NASONEX INHALE 2 SPRAYS INTO EACH NOSTRIL DAILY   montelukast 10 MG tablet Commonly known as: SINGULAIR TAKE 1 TABLET (10 MG TOTAL) BY MOUTH AT BEDTIME.   MULTI-VITAMIN PO Take by mouth.   nitroGLYCERIN 0.2 mg/hr patch Commonly known as: NITRODUR - Dosed in mg/24 hr    Tri-Previfem 0.18/0.215/0.25 MG-35 MCG tablet Generic drug: Norgestimate-Ethinyl Estradiol Triphasic       Known medication allergies: Allergies  Allergen Reactions  . Septra [Sulfamethoxazole-Trimethoprim] Swelling  . Amoxicillin Rash     Physical examination: Blood pressure (!) 142/90, pulse 75, temperature (!) 97.3 F (36.3 C), temperature source Temporal, resp. rate 16, height 5' 9.69" (1.77 m), weight 265 lb 12.8 oz (120.6 kg), SpO2 95 %.  General: Alert, interactive, in no acute distress. HEENT: PERRLA, TMs pearly gray, turbinates minimally edematous without discharge, post-pharynx non erythematous. Neck: Supple without lymphadenopathy. Lungs: Clear to auscultation without wheezing, rhonchi or rales. {no increased work of breathing. CV: Normal S1, S2 without murmurs. Abdomen: Nondistended, nontender. Skin: Warm and dry, without lesions or rashes. Extremities:  No clubbing, cyanosis or edema. Neuro:   Grossly intact.  Diagnositics/Labs:  Spirometry: FEV1: 3.45L 106%, FVC: 4.02L 97%, ratio consistent with nonobstructive pattern  Assessment and plan:   Mild persistent asthma   - doing well at this time  - Use Qvar 80 mcg 2 puffs twice a day to prevent cough and wheeze.    During flares/illnesses increase Qvar to 3 puffs three times a day.     - ProAir Respiclick 2 puffs every 4 hours as needed for cough, wheeze, shortness of breath, chest tightness.  Use either nebulizer or inhaler at a time.    Allergic rhinoconjunctivitis  - Use Astepro 2 sprays twice daily.  - Continue Nasonex 2 sprays daily in AM  - Continue Singulair each evening.  - Continue allegra 180mg  in PM  - nasal saline rinses once a day at night.  Use this before medicated nasal sprays.    Reflux  - continue famotidine 20 mg twice a day to decrease reflux  Call the clinic if this treatment plan is not working well for you  Follow-up 6-9 months or sooner if needed.  I appreciate the opportunity  to take part in Sparkles's care. Please do not hesitate to contact me with questions.  Sincerely,   Prudy Feeler, MD Allergy/Immunology [facility]

## 2019-07-13 MED FILL — QVAR REDIHALER 80 MCG/ACT A: 80 | 30 days supply | Qty: 11 | Fill #3

## 2019-07-18 ENCOUNTER — Other Ambulatory Visit: Payer: Self-pay | Admitting: *Deleted

## 2019-07-18 MED ORDER — MONTELUKAST SODIUM 10 MG PO TABS: 10.0000 mg | ORAL_TABLET | Freq: Every day | ORAL | 1 refills | Status: DC

## 2019-07-18 MED FILL — MONTELUKAST SOD 10 MG TAB: 10 | 90 days supply | Qty: 90 | Fill #0

## 2019-07-18 MED FILL — MOMETASONE FUROATE 50 MCG S: 50 | 30 days supply | Qty: 17 | Fill #2

## 2019-07-19 MED FILL — LISINOPRIL 10 MG TABS: 10 | 90 days supply | Qty: 90 | Fill #1

## 2019-08-06 MED FILL — QVAR REDIHALER 80 MCG/ACT A: 80 | 30 days supply | Qty: 11 | Fill #4

## 2019-08-09 ENCOUNTER — Other Ambulatory Visit: Payer: Self-pay | Admitting: Allergy

## 2019-08-10 MED FILL — AZELASTINE HCL 137 MCG SPRY: 0.1 | 50 days supply | Qty: 30 | Fill #0

## 2019-08-17 ENCOUNTER — Other Ambulatory Visit: Payer: Self-pay | Admitting: Allergy

## 2019-08-17 MED FILL — MOMETASONE FUROATE 50 MCG S: 50 | 30 days supply | Qty: 17 | Fill #0

## 2019-08-19 DIAGNOSIS — R7303 Prediabetes: Secondary | ICD-10-CM | POA: Diagnosis not present

## 2019-08-19 DIAGNOSIS — E7800 Pure hypercholesterolemia, unspecified: Secondary | ICD-10-CM | POA: Diagnosis not present

## 2019-09-06 ENCOUNTER — Other Ambulatory Visit: Payer: Self-pay | Admitting: Family Medicine

## 2019-09-06 MED FILL — QVAR REDIHALER 80 MCG/ACT A: 80 | 30 days supply | Qty: 11 | Fill #0

## 2019-09-10 MED FILL — MOMETASONE FUROATE 50 MCG S: 50 | 30 days supply | Qty: 17 | Fill #1

## 2019-09-19 MED FILL — AZELASTINE HCL 137 MCG SPRY: 0.1 | 25 days supply | Qty: 30 | Fill #1

## 2019-09-23 MED FILL — TRI FEMYNOR 28 TABLET: 0.18/0.215/ | 84 days supply | Qty: 84 | Fill #2

## 2019-10-10 MED FILL — MOMETASONE FUROATE 50 MCG S: 50 | 30 days supply | Qty: 17 | Fill #2

## 2019-10-17 MED FILL — LISINOPRIL 10 MG TABS: 10 | 90 days supply | Qty: 90 | Fill #0

## 2019-10-24 ENCOUNTER — Other Ambulatory Visit: Payer: Self-pay | Admitting: Allergy and Immunology

## 2019-10-24 MED FILL — AZELASTINE HCL 137 MCG SPRY: 0.1 | 25 days supply | Qty: 30 | Fill #0

## 2019-10-24 MED FILL — QVAR REDIHALER 80 MCG/ACT A: 80 | 30 days supply | Qty: 11 | Fill #1

## 2019-10-31 MED FILL — MONTELUKAST SOD 10 MG TAB: 10 | 90 days supply | Qty: 90 | Fill #1

## 2019-11-09 MED FILL — MOMETASONE FUROATE 50 MCG S: 50 | 30 days supply | Qty: 17 | Fill #3

## 2019-11-14 MED FILL — AZELASTINE HCL 137 MCG SPRY: 0.1 | 25 days supply | Qty: 30 | Fill #1

## 2019-11-17 MED FILL — QVAR REDIHALER 80 MCG/ACT A: 80 | 30 days supply | Qty: 11 | Fill #2

## 2019-12-05 ENCOUNTER — Telehealth: Payer: Self-pay | Admitting: Allergy

## 2019-12-05 NOTE — Telephone Encounter (Signed)
Patient called stating that she is having chest congestion and nasal congestion that is draining.  She has tried all of her usual medicines with no relief.  She is wanting to know if her nebulizer would be the next course of action.  If so she will need a refill for the albutrol drops.  She can be reached at [phone]

## 2019-12-05 NOTE — Telephone Encounter (Signed)
Dr. Nelva Bush please advise:  Patient called stating that she is having chest congestion and nasal congestion that is draining.  She has tried all of her usual medicines with no relief.  She is wanting to know if her nebulizer would be the next course of action.  If so she will need a refill for the albutrol drops.   She can be reached at [phone]

## 2019-12-06 ENCOUNTER — Other Ambulatory Visit: Payer: Self-pay | Admitting: *Deleted

## 2019-12-06 MED ORDER — DOXYCYCLINE MONOHYDRATE 100 MG PO TABS: 100.0000 mg | ORAL_TABLET | Freq: Two times a day (BID) | ORAL | 0 refills | Status: DC

## 2019-12-06 MED ORDER — PREDNISONE 10 MG PO TABS: 10.0000 mg | ORAL_TABLET | Freq: Two times a day (BID) | ORAL | 0 refills | Status: DC

## 2019-12-06 MED FILL — DOXYCYCLINE MONOHYDRATE 100: 100 | 10 days supply | Qty: 20 | Fill #0

## 2019-12-06 MED FILL — predniSONE 10 MG TABS: 10 | 10 days supply | Qty: 20 | Fill #0

## 2019-12-06 NOTE — Telephone Encounter (Signed)
Called and spoke with patient and she stated that her symptoms have been occurring since the end of last week. She states that she has a lot of sinus pressure that is causing her teeth and ears to hurt. She has tried taking Ibuprofen, Mucinex DM, and her nasal sprays and is not having any relief. She states that to her knowledge she has not been exposed to anyone with COVID.

## 2019-12-06 NOTE — Telephone Encounter (Signed)
Please get more information. How long have symptoms been present? Any other symptoms with chest and nasal congestion? Any COVID exposures?  Would recommend COVID testing to ensure symptoms are not related to infection.  Go ahead and refill albuterol vial for nebulizer so she has on hand.   And additional recommendations can be made based on her symptoms and duration.

## 2019-12-06 NOTE — Telephone Encounter (Signed)
Prescriptions have been sent in to pharmacy. Called patient and informed. Patient verbalized understanding and will call back if she needs anything further.

## 2019-12-06 NOTE — Telephone Encounter (Signed)
Oh no this sounds like a sinus infection.  If symptoms are worsening would recommend treating for sinus infection with Doxycycline 100mg  twice a day x 10 days and the equivalent of a prednisone day pack.   She should continue her routine medications and can increase her controller inhaler to the asthma action plan dosing.

## 2019-12-07 MED FILL — AZELASTINE HCL 137 MCG SPRY: 0.1 | 25 days supply | Qty: 30 | Fill #2

## 2019-12-09 MED FILL — MOMETASONE FUROATE 50 MCG S: 50 | 30 days supply | Qty: 17 | Fill #4

## 2019-12-12 ENCOUNTER — Other Ambulatory Visit: Payer: Self-pay

## 2019-12-12 ENCOUNTER — Other Ambulatory Visit: Payer: Self-pay | Admitting: Family Medicine

## 2019-12-12 ENCOUNTER — Ambulatory Visit (INDEPENDENT_AMBULATORY_CARE_PROVIDER_SITE_OTHER): Payer: 59 | Admitting: Family Medicine

## 2019-12-12 ENCOUNTER — Encounter: Payer: Self-pay | Admitting: Family Medicine

## 2019-12-12 ENCOUNTER — Ambulatory Visit (INDEPENDENT_AMBULATORY_CARE_PROVIDER_SITE_OTHER): Payer: 59

## 2019-12-12 VITALS — BP 150/98 | HR 76 | Temp 97.9°F | Wt 261.0 lb

## 2019-12-12 DIAGNOSIS — R059 Cough, unspecified: Secondary | ICD-10-CM

## 2019-12-12 DIAGNOSIS — R0602 Shortness of breath: Secondary | ICD-10-CM

## 2019-12-12 DIAGNOSIS — Z9189 Other specified personal risk factors, not elsewhere classified: Secondary | ICD-10-CM

## 2019-12-12 DIAGNOSIS — Z20822 Contact with and (suspected) exposure to covid-19: Secondary | ICD-10-CM

## 2019-12-12 DIAGNOSIS — J4541 Moderate persistent asthma with (acute) exacerbation: Secondary | ICD-10-CM | POA: Diagnosis not present

## 2019-12-12 DIAGNOSIS — R05 Cough: Secondary | ICD-10-CM

## 2019-12-12 DIAGNOSIS — R0789 Other chest pain: Secondary | ICD-10-CM | POA: Diagnosis not present

## 2019-12-12 MED ORDER — MOXIFLOXACIN HCL 400 MG PO TABS: 400.0000 mg | ORAL_TABLET | Freq: Every day | ORAL | 0 refills | Status: DC

## 2019-12-12 MED ORDER — PREDNISONE 20 MG PO TABS: ORAL_TABLET | ORAL | 0 refills | Status: DC

## 2019-12-12 NOTE — Patient Instructions (Addendum)
Discontinue Doxycyline.  Start Avelox once daily for 10 days.  Tomorrow start prednisone 40 mg orally daily, 20 mg x 2 days, 20 mg x 2days.  Continue with recommendations by allergist and step up therapy with Qvar.  I will notify you via MyChart of your chest x-ray results.  Your COVID 19 results will be available in 48-72 hours. Negative results are immediately resulted to Mychart. All positive results are communicated with a phone call from our office.     Acute Bronchitis, Adult  Acute bronchitis is when air tubes in the lungs (bronchi) suddenly get swollen. The condition can make it hard for you to breathe. In adults, acute bronchitis usually goes away within 2 weeks. A cough caused by bronchitis may last up to 3 weeks. Smoking, allergies, and asthma can make the condition worse. What are the causes? This condition is caused by:  Cold and flu viruses. The most common cause of this condition is the virus that causes the common cold.  Bacteria.  Substances that irritate the lungs, including: ? Smoke from cigarettes and other types of tobacco. ? Dust and pollen. ? Fumes from chemicals, gases, or burned fuel. ? Other materials that pollute indoor or outdoor air.  Close contact with someone who has acute bronchitis. What increases the risk? The following factors may make you more likely to develop this condition:  A weak body's defense system. This is also called the immune system.  Any condition that affects your lungs and breathing, such as asthma. What are the signs or symptoms? Symptoms of this condition include:  A cough.  Coughing up clear, yellow, or green mucus.  Wheezing.  Chest congestion.  Shortness of breath.  A fever.  Body aches.  Chills.  A sore throat. How is this treated? Acute bronchitis may go away over time without treatment. Your doctor may recommend:  Drinking more fluids.  Taking a medicine for a fever or cough.  Using a device that  gets medicine into your lungs (inhaler).  Using a vaporizer or a humidifier. These are machines that add water or moisture in the air to help with coughing and poor breathing. Follow these instructions at home:  Activity  Get a lot of rest.  Avoid places where there are fumes from chemicals.  Return to your normal activities as told by your doctor. Ask your doctor what activities are safe for you. Lifestyle  Drink enough fluids to keep your pee (urine) pale yellow.  Do not drink alcohol.  Do not use any products that contain nicotine or tobacco, such as cigarettes, e-cigarettes, and chewing tobacco. If you need help quitting, ask your doctor. Be aware that: ? Your bronchitis will get worse if you smoke or breathe in other people's smoke (secondhand smoke). ? Your lungs will heal faster if you quit smoking. General instructions  Take over-the-counter and prescription medicines only as told by your doctor.  Use an inhaler, cool mist vaporizer, or humidifier as told by your doctor.  Rinse your mouth often with salt water. To make salt water, dissolve -1 tsp (3-6 g) of salt in 1 cup (237 mL) of warm water.  Keep all follow-up visits as told by your doctor. This is important. How is this prevented? To lower your risk of getting this condition again:  Wash your hands often with soap and water. If soap and water are not available, use hand sanitizer.  Avoid contact with people who have cold symptoms.  Try not to touch your  mouth, nose, or eyes with your hands.  Make sure to get the flu shot every year. Contact a doctor if:  Your symptoms do not get better in 2 weeks.  You vomit more than once or twice.  You have symptoms of loss of fluid from your body (dehydration). These include: ? Dark urine. ? Dry skin or eyes. ? Increased thirst. ? Headaches. ? Confusion. ? Muscle cramps. Get help right away if:  You cough up blood.  You have chest pain.  You have very bad  shortness of breath.  You become dehydrated.  You faint or keep feeling like you are going to faint.  You keep vomiting.  You have a very bad headache.  Your fever or chills get worse. These symptoms may be an emergency. Do not wait to see if the symptoms will go away. Get medical help right away. Call your local emergency services (911 in the [place] Do not drive yourself to the hospital. Summary  Acute bronchitis is when air tubes in the lungs (bronchi) suddenly get swollen. In adults, acute bronchitis usually goes away within 2 weeks.  Take over-the-counter and prescription medicines only as told by your doctor.  Drink enough fluid to keep your pee (urine) pale yellow.  Contact a doctor if your symptoms do not improve after 2 weeks of treatment.  Get help right away if you cough up blood, faint, or have chest pain or shortness of breath. This information is not intended to replace advice given to you by your health care provider. Make sure you discuss any questions you have with your health care provider. Document Revised: 05/06/2019 Document Reviewed: 05/06/2019 Elsevier Patient Education  [place].

## 2019-12-12 NOTE — Telephone Encounter (Signed)
I believe that the patient will have to get COVID tested first before getting the Chest xray due to if she is positive for COVID they will not let her be seen to have the chest xray. I did speak with Garlon Hatchet and he suggested having her seen at the Respiratory Clinic that way they can do a chest x ray and test for COVID if they think it is necessary. Please advise.

## 2019-12-12 NOTE — Progress Notes (Signed)
Patient ID: DARRYL SCHLITT, female    DOB: 09/22/1966, 54 y.o.   MRN: [id number]  PCP: Gaynelle Arabian, MD  Chief Complaint  Patient presents with  . Sinus Problem    COVID Test   . Shortness of Breath    Subjective:  HPI  Regina Rice is a 54 y.o. female presents to [facility] clinic for evaluation of symptoms related persistent sinus pressure , shortness of breath, and mild cough.  Patient has not recently been screened for COVID-19. She has a medical history significant for chronic asthma, hypertension, and morbid obesity.   Asthma symptoms are mild and typically controlled unless triggered by a URI. Current symptoms present since 12/05/19. Symptoms include sinus pressure, congestion, mild headache, cough, mild shortness of breath, and chest tightness.  Started Doxycyline 7 days ago and has achieved mild relief of sinus symptoms, however, lower respiratory symptoms remain present and unchanged. She is also taking prednisone 10 mg twice daily without significant improvement of bronchial inflammation.  She is afebrile. No changes to smell or taste. No known exposure to individuals positive for COVID-19. Works in [employer].   Review of Systems Pertinent negatives listed in HPI  Patient Active Problem List   Diagnosis Date Noted  . Gastroesophageal reflux disease 12/31/2018  . Mild persistent asthma, uncomplicated [id number]  . Allergic rhinoconjunctivitis 08/25/2016      Prior to Admission medications   Medication Sig Start Date End Date Taking? Authorizing Provider  albuterol (PROVENTIL) (2.5 MG/3ML) 0.083% nebulizer solution Take 3 mLs (2.5 mg total) by nebulization every 4 (four) hours as needed for wheezing or shortness of breath. 07/23/17  Yes Kennith Gain, MD  aspirin 81 MG tablet Take 81 mg by mouth daily.   Yes [provider]  azelastine (ASTELIN) 0.1 % nasal spray USE 2 SPRAYS IN EACH NOSTRIL TWICE A DAY AS DIRECTED 10/24/19  Yes Kozlow, Donnamarie Poag, MD  doxycycline (ADOXA) 100 MG tablet Take 1 tablet (100 mg total) by mouth 2 (two) times daily for 10 days. 12/06/19 12/16/19 Yes Padgett, Rae Halsted, MD  famotidine (PEPCID) 20 MG tablet Take 1 tablet (20 mg total) by mouth 2 (two) times daily. 01/03/19  Yes Ambs, Kathrine Cords, FNP  fexofenadine (ALLEGRA) 180 MG tablet Take 180 mg by mouth daily.   Yes [provider]  glucosamine-chondroitin 500-400 MG tablet Take 1 tablet by mouth 2 (two) times daily.   Yes [provider]  lisinopril (ZESTRIL) 10 MG tablet  04/26/19  Yes [provider]  mometasone (NASONEX) 50 MCG/ACT nasal spray INHALE 2 SPRAYS INTO EACH NOSTRIL DAILY 08/17/19  Yes Padgett, Rae Halsted, MD  montelukast (SINGULAIR) 10 MG tablet Take 1 tablet (10 mg total) by mouth at bedtime. 07/18/19  Yes Ambs, Kathrine Cords, FNP  Multiple Vitamin (MULTI-VITAMIN PO) Take by mouth.   Yes [provider]  nitroGLYCERIN (NITRODUR - DOSED IN MG/24 HR) 0.2 mg/hr patch  04/05/19  Yes [provider]  predniSONE (DELTASONE) 10 MG tablet Take 1 tablet (10 mg total) by mouth 2 (two) times daily with a meal for 10 days. 12/06/19 12/16/19 Yes Padgett, Rae Halsted, MD  PROAIR RESPICLICK [id number] Base) MCG/ACT AEPB INHALE 2 PUFFS BY MOUTH INTO THE LUNGS EVERY 6 HOURS AS NEEDED. 05/02/19  Yes Ambs, Kathrine Cords, FNP  QVAR REDIHALER 80 MCG/ACT inhaler INHALE 2 PUFFS BY MOUTH 2 TIMES DAILY. 09/06/19  Yes Kennith Gain, MD  TRI-PREVIFEM 0.18/0.215/0.25 MG-35 MCG tablet  10/24/15  Yes [provider]  beclomethasone (QVAR) 80 MCG/ACT inhaler Inhale 2 puffs into the lungs 2 (two) times daily. 08/26/16 07/02/18  Kennith Gain, MD    Past Medical, Surgical Family and Social History reviewed and updated.    Objective:   Today's Vitals   12/12/19 1910  BP: (!) 150/98  Pulse: 76  Temp: 97.9 F (36.6 C)  SpO2: 97%  Weight: 261 lb (118.4 kg)    Wt Readings from Last 3 Encounters:  12/12/19  261 lb (118.4 kg)  07/07/19 265 lb 12.8 oz (120.6 kg)  07/02/18 268 lb (121.6 kg)    Physical Exam Constitutional:      General: She is not in acute distress.    Appearance: She is obese. She is ill-appearing.  HENT:     Head: Normocephalic.     Nose: Mucosal edema and congestion present.     Right Turbinates: Enlarged.     Left Turbinates: Enlarged.     Mouth/Throat:     Mouth: Mucous membranes are moist.     Pharynx: No pharyngeal swelling or oropharyngeal exudate.  Cardiovascular:     Rate and Rhythm: Normal rate and regular rhythm.  Pulmonary:     Effort: No tachypnea or accessory muscle usage.     Breath sounds: Decreased air movement present. Examination of the right-upper field reveals rhonchi. Examination of the left-upper field reveals rhonchi. Rhonchi present. No wheezing or rales.  Musculoskeletal:        General: Normal range of motion.  Skin:    General: Skin is warm.  Neurological:     General: No focal deficit present.       Assessment & Plan:  1. Cough 2. Shortness of breath Step-up QVAR to 3 puffs 3 times daily as recommended by Asthma specialist  Take prednisone as follows: Take 40 mg by mouth  x 3 days, 20 mg x 2 days, and 10 mg x 2 days. -Adding Avelox to cover any possible PNA and for treatment of suspected Bronchitis  - Novel Coronavirus, NAA ([facility]), PENDING  - DG Chest Portable 2, negative  Result Date: 12/12/2019 CLINICAL DATA:  Cough, short of breath, chest tightness for 1 week EXAM: CHEST  2 VIEW PORTABLE COMPARISON:  01/02/2016 FINDINGS: The heart size and mediastinal contours are within normal limits. Both lungs are clear. The visualized skeletal structures are unremarkable. IMPRESSION: No active cardiopulmonary disease. Electronically Signed   By: Randa Ngo M.D.   On: 12/12/2019 20:40    3. Encounter for laboratory testing for COVID-19 virus -COVID TEST pending   4. Moderate persistent asthma with acute exacerbation -Continue current  inhaler therapy See # 2   12/13/19-patient notified via Mychart of negative chest -ray Covid test pending   -The patient was given clear instructions to go to ER or return to medical center if symptoms do not improve, worsen or new problems develop. The patient verbalized understanding.     Molli Barrows, FNP-C [facility], PRN Provider  [facility] [place], [place] Clinic Phone: [phone] Clinic Fax: [phone] Clinic Hours: 5:30 pm -7:30 pm (Monday-Friday)

## 2019-12-12 NOTE — Telephone Encounter (Signed)
Can you please have this patient go for a chest xray and also covid testing so that we can treat her appropriately. Please make sure she is using her Qvar 3 puffs three times a day as indicated by the asthma action plan. Thank you

## 2019-12-12 NOTE — Telephone Encounter (Signed)
Patient called back and states her head is a little better but she is still having tightness in her chest and is still congested. Patient states she is still taking all medications, doing her nebulizer twice a day, and she is taking the antibiotics and prednisone that were called in.  Please advise on next steps.

## 2019-12-12 NOTE — Telephone Encounter (Signed)
This sounds fine. Thank you

## 2019-12-12 NOTE — Telephone Encounter (Signed)
Patient called back and stated the Respiatory clinic was full tonight for their appointments and was instructed that they are unsure if they will be able to see the patient this evening due to their agenda. Advise patient we scheduled the appointment but don't have any control to what they do and she wanted to be seen today for COVID testing and Xray. I did mentioned to the patient to continue to do her Asthma Action plan and to finish her antibiotics and prednisone that was given to her last week. Patient will continue to wait to see if the Respiatory clinic can make availability this evening to see her and if not the next available with them is Wednesday. Advise patient if she is feeling worse and cannot be seen today and can't wait then she will need to seek medical attention either at the ER or urgent care.

## 2019-12-12 NOTE — Telephone Encounter (Signed)
Patient has been scheduled to be seen today at the respiratory clinic in [place]. Called patient and informed of recommendations and got her scheduled. Patient verbalized understanding and will be going to her appointment this evening.

## 2019-12-12 NOTE — Telephone Encounter (Signed)
Thank you :)

## 2019-12-12 NOTE — Telephone Encounter (Signed)
Webb Silversmith please advise since Dr. Nelva Bush is out of office today.

## 2019-12-12 NOTE — Progress Notes (Signed)
na

## 2019-12-12 NOTE — Telephone Encounter (Signed)
If she can not be seen by the respiratory clinic tonight, can you please make an appointment for covid testing at [facility] for the morning? Thank you

## 2019-12-13 ENCOUNTER — Telehealth: Payer: Self-pay

## 2019-12-13 NOTE — Telephone Encounter (Signed)
Patient was able to be seen yesterday and was given medications such Prednisone and had the COVID-19 testing yesterday as well as the X-ray. Patient states she doesn't need the COVID-19 testing at [facility]

## 2019-12-13 NOTE — Telephone Encounter (Signed)
Error

## 2019-12-13 NOTE — Telephone Encounter (Signed)
Patient called to give an update. She called the respiratory clinic and they wanted her to come in yesterday evening. Patient went in and did a COVID Test & Chest X-Ray. She was told to stop Doxycyline. She was given Avelox and also a graduated scale of prednisone.   Thanks

## 2019-12-13 NOTE — Telephone Encounter (Signed)
Thank you :)

## 2019-12-14 ENCOUNTER — Telehealth: Payer: Self-pay | Admitting: Nurse Practitioner

## 2019-12-14 ENCOUNTER — Other Ambulatory Visit: Payer: Self-pay | Admitting: Nurse Practitioner

## 2019-12-14 ENCOUNTER — Telehealth: Payer: Self-pay | Admitting: Family Medicine

## 2019-12-14 DIAGNOSIS — U071 COVID-19: Secondary | ICD-10-CM

## 2019-12-14 DIAGNOSIS — J4530 Mild persistent asthma, uncomplicated: Secondary | ICD-10-CM

## 2019-12-14 DIAGNOSIS — E6609 Other obesity due to excess calories: Secondary | ICD-10-CM

## 2019-12-14 LAB — NOVEL CORONAVIRUS, NAA: SARS-CoV-2, NAA: DETECTED — AB

## 2019-12-14 LAB — SPECIMEN STATUS REPORT

## 2019-12-14 NOTE — Telephone Encounter (Signed)
Patient has returned call.  Patient is now available to discuss results.

## 2019-12-14 NOTE — Telephone Encounter (Signed)
LMOM for patient to call back to discuss covid testing result

## 2019-12-14 NOTE — Telephone Encounter (Signed)
Patient called back. Please return her call.

## 2019-12-14 NOTE — Progress Notes (Signed)
  I connected by phone with Regina Rice on 12/14/2019 at 5:30 PM to discuss the potential use of an new treatment for mild to moderate COVID-19 viral infection in non-hospitalized patients.  This patient is a 54 y.o. female that meets the FDA criteria for Emergency Use Authorization of bamlanivimab or casirivimab\imdevimab.  Has a (+) direct SARS-CoV-2 viral test result  Has mild or moderate COVID-19   Is ? 54 years of age and weighs ? 40 kg  Is NOT hospitalized due to COVID-19  Is NOT requiring oxygen therapy or requiring an increase in baseline oxygen flow rate due to COVID-19  Is within 10 days of symptom onset  Has at least one of the high risk factor(s) for progression to severe COVID-19 and/or hospitalization as defined in EUA.  Specific high risk criteria : BMI >/= 35   I have spoken and communicated the following to the patient or parent/caregiver:  1. FDA has authorized the emergency use of bamlanivimab and casirivimab\imdevimab for the treatment of mild to moderate COVID-19 in adults and pediatric patients with positive results of direct SARS-CoV-2 viral testing who are 29 years of age and older weighing at least 40 kg, and who are at high risk for progressing to severe COVID-19 and/or hospitalization.  2. The significant known and potential risks and benefits of bamlanivimab and casirivimab\imdevimab, and the extent to which such potential risks and benefits are unknown.  3. Information on available alternative treatments and the risks and benefits of those alternatives, including clinical trials.  4. Patients treated with bamlanivimab and casirivimab\imdevimab should continue to self-isolate and use infection control measures (e.g., wear mask, isolate, social distance, avoid sharing personal items, clean and disinfect "high touch" surfaces, and frequent handwashing) according to CDC guidelines.   5. The patient or parent/caregiver has the option to accept or refuse  bamlanivimab or casirivimab\imdevimab .  After reviewing this information with the patient, The patient agreed to proceed with receiving the bamlanimivab infusion and will be provided a copy of the Fact sheet prior to receiving the infusion.  Regina Rice 12/14/2019 5:30 PM

## 2019-12-14 NOTE — Telephone Encounter (Signed)
Can you please make a 4 week follow up visit for this patient please. Thank you

## 2019-12-14 NOTE — Telephone Encounter (Signed)
Called to discuss with Regina Rice about Covid symptoms and the use of bamlanivimab, a monoclonal antibody infusion for those with mild to moderate Covid symptoms and at a high risk of hospitalization.     Pt is qualified for this infusion at the [facility] infusion center due to co-morbid conditions (BMI 37) and/or a member of an at-risk group. Patient scheduled for infusion on 12/15/19 at 1230pm as requested. She verbalized understanding of treatment and appointment details. MyChart message also sent as requested.   Symptoms tier reviewed as well as criteria for ending isolation.  Symptoms reviewed that would warrant ED/Hospital evaluation. Preventative practices reviewed. Patient verbalized understanding. Callback number to the infusion center given. Patient advised to go to Urgent care or ED with severe symptoms.   Patient Active Problem List   Diagnosis Date Noted  . Gastroesophageal reflux disease 12/31/2018  . Mild persistent asthma, uncomplicated [id number]  . Allergic rhinoconjunctivitis 08/25/2016    Alda Lea, AGPCNP-BC Pager: [phone] Amion: Bjorn Pippin

## 2019-12-14 NOTE — Telephone Encounter (Signed)
Patient notified of covid positive result. She has been given the antibody hotline number and will call today. She reports she is breathing better and her sinus symptoms are beginning to resolve. She has been advised by the health department to self quarantine. She will call with any further questions.

## 2019-12-15 ENCOUNTER — Ambulatory Visit (HOSPITAL_COMMUNITY)
Admission: RE | Admit: 2019-12-15 | Discharge: 2019-12-15 | Disposition: A | Discharge status: Home / Self Care | Payer: 59 | Source: Ambulatory Visit | Attending: Pulmonary Disease | Admitting: Pulmonary Disease

## 2019-12-15 ENCOUNTER — Telehealth: Payer: Self-pay | Admitting: Adult Health

## 2019-12-15 ENCOUNTER — Encounter: Payer: Self-pay | Admitting: Nurse Practitioner

## 2019-12-15 ENCOUNTER — Telehealth (HOSPITAL_COMMUNITY): Payer: Self-pay | Admitting: Nurse Practitioner

## 2019-12-15 DIAGNOSIS — Z6837 Body mass index (BMI) 37.0-37.9, adult: Secondary | ICD-10-CM | POA: Insufficient documentation

## 2019-12-15 DIAGNOSIS — E6609 Other obesity due to excess calories: Secondary | ICD-10-CM | POA: Insufficient documentation

## 2019-12-15 DIAGNOSIS — J4530 Mild persistent asthma, uncomplicated: Secondary | ICD-10-CM | POA: Diagnosis not present

## 2019-12-15 DIAGNOSIS — U071 COVID-19: Secondary | ICD-10-CM | POA: Diagnosis not present

## 2019-12-15 MED ORDER — METHYLPREDNISOLONE SODIUM SUCC 125 MG IJ SOLR: 125.0000 mg | Freq: Once | INTRAMUSCULAR | Status: DC | PRN

## 2019-12-15 MED ORDER — SODIUM CHLORIDE 0.9 % IV SOLN
700.0000 mg | Freq: Once | INTRAVENOUS | Status: AC
  Administered 2019-12-15: 12:00:00 700 mg via INTRAVENOUS
  Filled 2019-12-15: qty 20

## 2019-12-15 MED ORDER — CLONIDINE HCL 0.1 MG PO TABS
0.1000 mg | ORAL_TABLET | Freq: Once | ORAL | Status: AC
  Administered 2019-12-15: 0.1 mg via ORAL
  Filled 2019-12-15: qty 1

## 2019-12-15 MED ORDER — EPINEPHRINE 0.3 MG/0.3ML IJ SOAJ: 0.3000 mg | Freq: Once | INTRAMUSCULAR | Status: DC | PRN

## 2019-12-15 MED ORDER — SODIUM CHLORIDE 0.9 % IV SOLN
INTRAVENOUS | Status: DC | PRN
  Administered 2019-12-15: 250 mL via INTRAVENOUS

## 2019-12-15 MED ORDER — ALBUTEROL SULFATE HFA 108 (90 BASE) MCG/ACT IN AERS: 2.0000 | INHALATION_SPRAY | Freq: Once | RESPIRATORY_TRACT | Status: DC | PRN

## 2019-12-15 MED ORDER — FAMOTIDINE IN NACL 20-0.9 MG/50ML-% IV SOLN: 20.0000 mg | Freq: Once | INTRAVENOUS | Status: DC | PRN

## 2019-12-15 MED ORDER — DIPHENHYDRAMINE HCL 50 MG/ML IJ SOLN: 50.0000 mg | Freq: Once | INTRAMUSCULAR | Status: DC | PRN

## 2019-12-15 NOTE — Discharge Instructions (Signed)

## 2019-12-15 NOTE — Telephone Encounter (Signed)
Called toDiscuss with patient about Covid symptoms and the use of bamlanivimab, a monoclonal antibody infusion for those with mild to moderate Covid symptoms and at a high risk of hospitalization.   Pt is qualified for this infusion at the [facility] infusion center due to co-morbid conditions (BMI 37) and/or a member of an at-risk group.   Unable to reach pt. Left message on home line.  MyChart message has already been sent.  Mina Marble, NP-C

## 2019-12-15 NOTE — Telephone Encounter (Signed)
Called to Discuss with patient about Covid symptoms and the use of bamlanivimab, a monoclonal antibody infusion for those with mild to moderate Covid symptoms and at a high risk of hospitalization.     Pt is qualified for this infusion at the [facility] infusion center due to co-morbid conditions and/or a member of an at-risk group.     Unable to reach pt. Left message and sent mychart message.   Beckey Rutter, [place], AGNP-C [phone] ([place])

## 2019-12-15 NOTE — Progress Notes (Signed)
  Diagnosis: COVID-19  Physician: Dr. Asencion Noble  Procedure: Covid Infusion Clinic Med: bamlanivimab infusion - Provided patient with bamlanimivab fact sheet for patients, parents and caregivers prior to infusion.  Complications: No immediate complications noted.  Discharge: Discharged home   Gaye Alken 12/15/2019

## 2019-12-16 MED FILL — QVAR REDIHALER 80 MCG/ACT A: 80 | 30 days supply | Qty: 11 | Fill #3

## 2019-12-16 MED FILL — NORGESTIM-ETH ESTRAD TRIPHA: 0.18/0.215/ | 84 days supply | Qty: 84 | Fill #0

## 2019-12-28 ENCOUNTER — Telehealth: Payer: Self-pay | Admitting: Allergy

## 2019-12-28 ENCOUNTER — Other Ambulatory Visit: Payer: Self-pay | Admitting: *Deleted

## 2019-12-28 MED ORDER — ALBUTEROL SULFATE (2.5 MG/3ML) 0.083% IN NEBU: 2.5000 mg | INHALATION_SOLUTION | RESPIRATORY_TRACT | 1 refills | Status: DC | PRN

## 2019-12-28 MED FILL — ALBUTEROL 0.083 MG/ML SOLN: (2.5 MG/3ML | 10 days supply | Qty: 180 | Fill #0

## 2019-12-28 NOTE — Telephone Encounter (Signed)
Called and spoke with patient and advised. Patient verbalized understanding and has been scheduled with Dr. Nelva Bush tomorrow in the [place] office.

## 2019-12-28 NOTE — Telephone Encounter (Signed)
Can you please have her make an appointment so we can further evaluate her breathing? We can change her inhalers if that is appropriate. We can also discuss vaccine at this appointment. Thank you

## 2019-12-28 NOTE — Telephone Encounter (Signed)
I called and talked and spoke with the patient and refilled her medication. Advised to patient that her next appointment is not until May of this year. Patient verbalized understanding. She did state that she is still using her inhalers and Mucinex but is still having issues with shortness of breath and nasal congestion. She is wondering if there is anything else she can do or if this is normal due to her recovering from [place]. She is also wondering when can she can get the COVID vaccine after having her Monocolonal Antibodies? Please advise.

## 2019-12-28 NOTE — Telephone Encounter (Signed)
Patient called and needs albuterol for the nebulizer and also wanted to know if she suppose to made appointment for 4 weeks ? [phone]

## 2019-12-29 ENCOUNTER — Ambulatory Visit: Payer: 59 | Admitting: Allergy

## 2019-12-29 ENCOUNTER — Telehealth: Payer: Self-pay | Admitting: *Deleted

## 2019-12-29 ENCOUNTER — Other Ambulatory Visit: Payer: Self-pay

## 2019-12-29 ENCOUNTER — Encounter: Payer: Self-pay | Admitting: Allergy

## 2019-12-29 VITALS — BP 130/88 | HR 77 | Temp 97.9°F | Resp 20

## 2019-12-29 DIAGNOSIS — J4530 Mild persistent asthma, uncomplicated: Secondary | ICD-10-CM

## 2019-12-29 DIAGNOSIS — J3089 Other allergic rhinitis: Secondary | ICD-10-CM

## 2019-12-29 DIAGNOSIS — H1013 Acute atopic conjunctivitis, bilateral: Secondary | ICD-10-CM

## 2019-12-29 MED ORDER — ALBUTEROL SULFATE (2.5 MG/3ML) 0.083% IN NEBU: 2.5000 mg | INHALATION_SOLUTION | RESPIRATORY_TRACT | 1 refills | Status: DC | PRN

## 2019-12-29 MED ORDER — BUDESONIDE-FORMOTEROL FUMARATE 160-4.5 MCG/ACT IN AERO: 2.0000 | INHALATION_SPRAY | Freq: Two times a day (BID) | RESPIRATORY_TRACT | 5 refills | Status: DC

## 2019-12-29 MED ORDER — IPRATROPIUM BROMIDE 0.06 % NA SOLN: 2.0000 | Freq: Three times a day (TID) | NASAL | 5 refills | Status: DC

## 2019-12-29 MED ORDER — PROAIR RESPICLICK 108 (90 BASE) MCG/ACT IN AEPB: 1.0000 | INHALATION_SPRAY | Freq: Every day | RESPIRATORY_TRACT | 1 refills | Status: DC | PRN

## 2019-12-29 MED FILL — IPRATROPIUM 0.06% SPRAY: 0.06 | 13 days supply | Qty: 15 | Fill #0

## 2019-12-29 NOTE — Telephone Encounter (Signed)
Pharmacy sent a fax stating that Symbicort has a High Copay and recommended possibly sending in Advair Diskus since they believe that it would be a lower Copay with her plan. Please advise.

## 2019-12-29 NOTE — Patient Instructions (Addendum)
Covid illness  - recovering recent Covid illness still with lingering cough and nasal drainage  - has had monoclonal antibody infusion on 12/15/2019  - recommendation at this time is to get Covid vaccine approximately 90 days (3 months) or later after receiving antibody treatment  - you can call [employer] at [phone] Monday-Friday 8-6pm to get more information regarding plasma donation and if wanting to proceed how to schedule for plasma donation.   Per the reference from [facility] you absolutely can donate your plasma  Mild persistent asthma   - with continue cough will step up therapy  - stop Qvar at this time  - start Symbicort 17mcg 2 puffs twice a day.  Samples provided in office  - ProAir Respiclick 2 puffs or albuterol 1 vial via nebulizer every 4 hours as needed for cough, wheeze, shortness of breath, chest tightness.  Use either nebulizer or inhaler at a time.    Allergic rhinoconjunctivitis  - Use Astepro 2 sprays twice daily for nasal drainage.  - Continue Nasonex 2 sprays daily in AM for nasal congestion  - will prescribe nasal Atrovent 2 sprays each nostril up to 3-4 times a day as needed for nasal drainage  - Continue Singulair each evening.  - Continue allegra 180mg  in PM  - nasal saline rinses once a day at night.  Use this before medicated nasal sprays.    Reflux  - continue famotidine 20 mg twice a day to decrease reflux  Follow-up 4-6 months or sooner if needed.

## 2019-12-29 NOTE — Progress Notes (Signed)
Follow-up Note  RE: [name] MRN: [id number] DOB: May 07, 1966 Date of Office Visit: 12/29/2019   History of present illness: Regina Rice is a 54 y.o. female presenting today for follow-up of recent Covid illness.  She has history of asthma, allergic rhinitis with conjunctivitis and reflux.  She was last seen in the office on 07/07/2019 by myself.  Around early Feb 2021 she notified our office of symptoms including increased nasal and chest congestion with ear pain and teeth pain as well as increase sinus pressure.   I received this report and advised she have Covid testing done and also prescribed Doxycycline and prednisone to help with likely sinusitis.  Her sinus symptoms improved some however she had more chest tightness and shortness of breath.  She was set up with the Respiratory clinic who obtained an CXR and tested her for Covid which returned positive.  Her CXR was unremarkable.  They also expanded her prednisone course and her antibiotic course to Avelox to cover for potential pneumonia.  She was also set up to receive the monoclonal antibody therapy which she received on 12/15/2019.   She still continues to have a lingering cough and nasal drainage.  She states she is doing better however than she was previously.  She is still using her Qvar 3 puffs 3 times a day.  She is also still taking Mucinex to help with the drainage.  She continues on her regular medications of Singulair, Allegra, Astepro and Nasonex.    Review of systems: Review of Systems  Constitutional: Positive for malaise/fatigue.  HENT: Positive for congestion.   Eyes: Negative.   Respiratory: Positive for cough.   Cardiovascular: Negative.   Gastrointestinal: Negative.   Musculoskeletal: Negative.   Skin: Negative.   Neurological: Negative.     All other systems negative unless noted above in HPI  Past medical/social/surgical/family history have been reviewed and are unchanged unless specifically indicated  below.  No changes  Medication List: Current Outpatient Medications  Medication Sig Dispense Refill  . albuterol (PROVENTIL) (2.5 MG/3ML) 0.083% nebulizer solution Take 3 mLs (2.5 mg total) by nebulization every 4 (four) hours as needed for wheezing or shortness of breath. 150 mL 1  . aspirin 81 MG tablet Take 81 mg by mouth daily.    Marland Kitchen azelastine (ASTELIN) 0.1 % nasal spray USE 2 SPRAYS IN EACH NOSTRIL TWICE A DAY AS DIRECTED 30 mL 4  . famotidine (PEPCID) 20 MG tablet Take 1 tablet (20 mg total) by mouth 2 (two) times daily. 60 tablet 5  . fexofenadine (ALLEGRA) 180 MG tablet Take 180 mg by mouth daily.    Marland Kitchen glucosamine-chondroitin 500-400 MG tablet Take 1 tablet by mouth 2 (two) times daily.    Marland Kitchen lisinopril (ZESTRIL) 10 MG tablet     . mometasone (NASONEX) 50 MCG/ACT nasal spray INHALE 2 SPRAYS INTO EACH NOSTRIL DAILY 17 g 5  . montelukast (SINGULAIR) 10 MG tablet Take 1 tablet (10 mg total) by mouth at bedtime. 90 tablet 1  . Multiple Vitamin (MULTI-VITAMIN PO) Take by mouth.    . nitroGLYCERIN (NITRODUR - DOSED IN MG/24 HR) 0.2 mg/hr patch     . PROAIR RESPICLICK 123XX123 (90 Base) MCG/ACT AEPB INHALE 2 PUFFS BY MOUTH INTO THE LUNGS EVERY 6 HOURS AS NEEDED. 1 each 1  . QVAR REDIHALER 80 MCG/ACT inhaler INHALE 2 PUFFS BY MOUTH 2 TIMES DAILY. 10.6 g 3  . TRI-PREVIFEM 0.18/0.215/0.25 MG-35 MCG tablet   2   No  current facility-administered medications for this visit.     Known medication allergies: Allergies  Allergen Reactions  . Septra [Sulfamethoxazole-Trimethoprim] Swelling  . Amoxicillin Rash     Physical examination: Blood pressure 130/88, pulse 77, temperature 97.9 F (36.6 C), temperature source Temporal, resp. rate 20, SpO2 97 %.  General: Alert, interactive, in no acute distress. HEENT: PERRLA, TMs pearly gray, turbinates mildly edematous without discharge, post-pharynx non erythematous. Neck: Supple without lymphadenopathy. Lungs: Clear to auscultation without wheezing,  rhonchi or rales. {no increased work of breathing. CV: Normal S1, S2 without murmurs. Abdomen: Nondistended, nontender. Skin: Warm and dry, without lesions or rashes. Extremities:  No clubbing, cyanosis or edema. Neuro:   Grossly intact.  Diagnositics/Labs: None today  Assessment and plan:   Covid illness  - recovering recent Covid illness still with lingering cough and nasal drainage  - has had monoclonal antibody infusion on 12/15/2019  - recommendation at this time is to get Covid vaccine approximately 90 days (3 months) or later after receiving antibody treatment  - you can call [employer] at [phone] Monday-Friday 8-6pm to get more information regarding plasma donation and if wanting to proceed how to schedule for plasma donation.   Per the reference from [facility] you absolutely can donate your plasma  Mild persistent asthma   - with continue cough will step up therapy  - stop Qvar at this time  - start Symbicort 194mcg 2 puffs twice a day.  Samples provided in office  - ProAir Respiclick 2 puffs or albuterol 1 vial via nebulizer every 4 hours as needed for cough, wheeze, shortness of breath, chest tightness.  Use either nebulizer or inhaler at a time.    Allergic rhinoconjunctivitis  - Use Astepro 2 sprays twice daily for nasal drainage.  - Continue Nasonex 2 sprays daily in AM for nasal congestion  - will prescribe nasal Atrovent 2 sprays each nostril up to 3-4 times a day as needed for nasal drainage  - Continue Singulair each evening.  - Continue allegra 180mg  in PM  - nasal saline rinses once a day at night.  Use this before medicated nasal sprays.    Reflux  - continue famotidine 20 mg twice a day to decrease reflux  Follow-up 4-6 months or sooner if needed.  I appreciate the opportunity to take part in Coreena's care. Please do not hesitate to contact me with questions.  Sincerely,   Prudy Feeler, MD Allergy/Immunology [facility] [facility]  [place]

## 2019-12-30 NOTE — Telephone Encounter (Signed)
Patient called back and verbalized understanding. She stated that she can tell an improvement with the Symbicort and may decide to pay the 69.50 once samples ran out. Advised to patient to call at the end of the month when samples are used up and we will go from there on a decision for her together. Patient verbalized understanding.

## 2019-12-30 NOTE — Telephone Encounter (Signed)
Unfortunately they did not list any other alternatives. Just the Advair Diskus.

## 2019-12-30 NOTE — Telephone Encounter (Signed)
Per pharmacist the Symbicort cost is 69.50  Advair Diskus is $5. And HFA is $99. Qvar was 80 with coupon $25 please advise on alternative. These medications are under patient formulary list just cost was the issue here.

## 2019-12-30 NOTE — Telephone Encounter (Signed)
Called and left a voicemail asking for patient to return call to discuss.  °

## 2019-12-30 NOTE — Telephone Encounter (Signed)
This is so ridiculous that none of the non-dry powder options are cost-effective.    Well I suppose once we try the diskus and if it not effective then we can get something else covered.   She also may be better on the symbcort and able once she finishes the samples to go back to Qvar use.  Please have her continue Symbicort until samples run out.   Once they one out she can go back to Qvar use and see if this will continue to keep symptoms under control.

## 2019-12-30 NOTE — Telephone Encounter (Signed)
If we can find the other alternatives besides Advair diskus that would be great.   I would even prefer Advair HFA over the diskus.

## 2019-12-30 NOTE — Telephone Encounter (Signed)
Did they give any other aerosol alternatives.   I would like to not use a dry powder if able

## 2020-01-01 MED FILL — AZELASTINE HCL 137 MCG SPRY: 0.1 | 25 days supply | Qty: 30 | Fill #3

## 2020-01-08 MED FILL — MOMETASONE FUROATE 50 MCG S: 50 | 30 days supply | Qty: 17 | Fill #5

## 2020-01-10 ENCOUNTER — Other Ambulatory Visit: Payer: Self-pay | Admitting: Allergy

## 2020-01-10 MED FILL — SYMBICORT 160-4.5 MCG INH: 160-4.5 | 30 days supply | Qty: 10 | Fill #0

## 2020-01-26 MED FILL — AZELASTINE HCL 137 MCG SPRY: 0.1 | 25 days supply | Qty: 30 | Fill #4

## 2020-01-27 ENCOUNTER — Other Ambulatory Visit: Payer: Self-pay | Admitting: Family Medicine

## 2020-01-27 MED FILL — LISINOPRIL 10 MG TABS: 10 | 90 days supply | Qty: 90 | Fill #1

## 2020-01-30 MED FILL — MONTELUKAST SOD 10 MG TAB: 10 | 90 days supply | Qty: 90 | Fill #0

## 2020-02-17 ENCOUNTER — Other Ambulatory Visit (HOSPITAL_COMMUNITY): Payer: Self-pay | Admitting: Family Medicine

## 2020-02-17 DIAGNOSIS — I10 Essential (primary) hypertension: Secondary | ICD-10-CM | POA: Diagnosis not present

## 2020-02-17 DIAGNOSIS — R7303 Prediabetes: Secondary | ICD-10-CM | POA: Diagnosis not present

## 2020-02-17 DIAGNOSIS — E7800 Pure hypercholesterolemia, unspecified: Secondary | ICD-10-CM | POA: Diagnosis not present

## 2020-02-17 DIAGNOSIS — M79604 Pain in right leg: Secondary | ICD-10-CM | POA: Diagnosis not present

## 2020-02-17 DIAGNOSIS — J45909 Unspecified asthma, uncomplicated: Secondary | ICD-10-CM | POA: Diagnosis not present

## 2020-02-17 DIAGNOSIS — Z0000 Encounter for general adult medical examination without abnormal findings: Secondary | ICD-10-CM | POA: Diagnosis not present

## 2020-02-17 MED FILL — ROSUVASTATIN CALCIUM 10 MG: 10 | 90 days supply | Qty: 90 | Fill #0

## 2020-02-20 ENCOUNTER — Other Ambulatory Visit: Payer: Self-pay | Admitting: *Deleted

## 2020-02-20 ENCOUNTER — Other Ambulatory Visit: Payer: Self-pay | Admitting: Allergy and Immunology

## 2020-02-20 MED FILL — AZELASTINE HCL 137 MCG SPRY: 0.1 | 25 days supply | Qty: 30 | Fill #0

## 2020-02-21 MED FILL — IPRATROPIUM 0.06% SPRAY: 0.06 | 13 days supply | Qty: 15 | Fill #1

## 2020-02-21 MED FILL — SYMBICORT 160-4.5 MCG INH: 160-4.5 | 30 days supply | Qty: 10 | Fill #1

## 2020-03-06 ENCOUNTER — Other Ambulatory Visit: Payer: Self-pay | Admitting: Obstetrics and Gynecology

## 2020-03-06 DIAGNOSIS — Z1231 Encounter for screening mammogram for malignant neoplasm of breast: Secondary | ICD-10-CM

## 2020-03-09 ENCOUNTER — Ambulatory Visit: Payer: 59 | Admitting: Allergy

## 2020-03-12 MED FILL — AZELASTINE HCL 137 MCG SPRY: 0.1 | 25 days supply | Qty: 30 | Fill #1

## 2020-03-14 ENCOUNTER — Other Ambulatory Visit: Payer: Self-pay | Admitting: Allergy

## 2020-03-14 MED FILL — ALBUTEROL 0.083 MG/ML SOLN: (2.5 MG/3ML | 10 days supply | Qty: 180 | Fill #1

## 2020-03-14 MED FILL — MOMETASONE FUROATE 50 MCG S: 50 | 30 days supply | Qty: 17 | Fill #0

## 2020-03-22 MED FILL — IPRATROPIUM 0.06% SPRAY: 0.06 | 13 days supply | Qty: 15 | Fill #2

## 2020-03-23 ENCOUNTER — Other Ambulatory Visit: Payer: Self-pay

## 2020-03-23 ENCOUNTER — Ambulatory Visit
Admission: RE | Admit: 2020-03-23 | Discharge: 2020-03-23 | Disposition: A | Discharge status: Home / Self Care | Payer: 59 | Source: Ambulatory Visit | Attending: Obstetrics and Gynecology | Admitting: Obstetrics and Gynecology

## 2020-03-23 DIAGNOSIS — Z1231 Encounter for screening mammogram for malignant neoplasm of breast: Secondary | ICD-10-CM | POA: Diagnosis not present

## 2020-03-24 ENCOUNTER — Ambulatory Visit: Payer: 59 | Attending: Internal Medicine

## 2020-03-24 DIAGNOSIS — Z23 Encounter for immunization: Secondary | ICD-10-CM

## 2020-03-24 NOTE — Progress Notes (Signed)
   Covid-19 Vaccination Clinic  Name:  Regina Rice    MRN: [id number] DOB: Feb 20, 1966  03/24/2020  Regina Rice was observed post Covid-19 immunization for 15 minutes without incident. She was provided with Vaccine Information Sheet and instruction to access the V-Safe system.   Regina Rice was instructed to call 911 with any severe reactions post vaccine: Marland Kitchen Difficulty breathing  . Swelling of face and throat  . A fast heartbeat  . A bad rash all over body  . Dizziness and weakness   Immunizations Administered    Name Date Dose VIS Date Route   Pfizer COVID-19 Vaccine 03/24/2020 10:08 AM 0.3 mL 12/21/2018 Intramuscular   Manufacturer: [place]   Lot: [id number]   [place]: [id number]

## 2020-03-28 MED FILL — SYMBICORT 160-4.5 MCG INH: 160-4.5 | 30 days supply | Qty: 10 | Fill #2

## 2020-04-10 ENCOUNTER — Other Ambulatory Visit: Payer: Self-pay

## 2020-04-10 ENCOUNTER — Ambulatory Visit: Payer: 59 | Attending: Family Medicine | Admitting: Physical Therapy

## 2020-04-10 DIAGNOSIS — G8929 Other chronic pain: Secondary | ICD-10-CM | POA: Insufficient documentation

## 2020-04-10 DIAGNOSIS — M5441 Lumbago with sciatica, right side: Secondary | ICD-10-CM | POA: Insufficient documentation

## 2020-04-10 NOTE — Therapy (Signed)
[facility] [address] [place], [place], [address] Phone: [phone]   Fax:  [phone]  Physical Therapy Evaluation  Patient Details  Name: Regina Rice MRN: [id number] Date of Birth: 1966/01/31 Referring Provider (PT): Gaynelle Arabian MD   Encounter Date: 04/10/2020   PT End of Session - 04/10/20 0930    Visit Number 1    Date for PT Re-Evaluation 06/05/20    Authorization Type UMR    PT Start Time 0930    PT Stop Time 1016    PT Time Calculation (min) 46 min    Activity Tolerance Patient tolerated treatment well    Behavior During Therapy [facility] for tasks assessed/performed           Past Medical History:  Diagnosis Date  . Asthma   . Migraines     Past Surgical History:  Procedure Laterality Date  . ADENOIDECTOMY    . DILATION AND CURETTAGE OF UTERUS    . TONSILLECTOMY      There were no vitals filed for this visit.    Subjective Assessment - 04/10/20 0932    Subjective Had sciatic pain in the past and is now feeling it again. Sometimes she cannot sleep it's so bad at night. Started about 9 months ago. She had PT in the past which resolved with stretches.    Pertinent History HTN, asthma, migraines, high cholesterol, broken left shoulder (years ago), bil knee pain (arthritis), low back pain    Limitations Other (comment)   sleep   Patient Stated Goals to get rid of pain    Currently in Pain? Yes    Pain Score 8     Pain Location Leg    Pain Orientation Right    Pain Descriptors / Indicators Squeezing    Pain Type Chronic pain    Pain Radiating Towards to toes    Pain Onset More than a month ago    Pain Frequency Intermittent    Aggravating Factors  unsure/inconsistent    Pain Relieving Factors nothing    Effect of Pain on Daily Activities limits sleep and ability to do ADLS              [facility] PT Assessment - 04/10/20 0001      Assessment   Medical Diagnosis right leg pain     Referring Provider (PT) Gaynelle Arabian MD    Onset Date/Surgical Date 06/28/19    Prior Therapy Yes - pain resolved      Precautions   Precautions None      Restrictions   Weight Bearing Restrictions No      Balance Screen   Has the patient fallen in the past 6 months No    Has the patient had a decrease in activity level because of a fear of falling?  No    Is the patient reluctant to leave their home because of a fear of falling?  No      Home Ecologist residence    Living Arrangements Spouse/significant other      Prior Function   Level of Independence Independent    Vocation Full time employment    Vocation Requirements preschool teacher      Observation/Other Assessments   Focus on Therapeutic Outcomes (FOTO)  39% limited      Posture/Postural Control   Posture Comments increased lordosis      ROM / Strength   AROM / PROM / Strength  AROM;Strength      AROM   Overall AROM Comments lumbar WNL with some tightness on left with right SB; BLE WNL      Strength   Overall Strength Comments Bil hip ABD 4+/5. left hip ext 4+/5, right hip ext 5/5, left knee flex 4/5, right knee flex 5/5, bil knee ext 5/5; bil ankle DF 5/5      Flexibility   Soft Tissue Assessment /Muscle Length yes    Hamstrings tight Rt    Quadriceps WNL    ITB WNL    Piriformis WNL    Quadratus Lumborum tight right      Palpation   Spinal mobility left L5/S1 stiff and painful; right L5/S1 and L3/4 painful with UPA mobs    Palpation comment tender in right gluteals and piriformis at SIJ mostly      Special Tests   Other special tests neg SLR and slump bil; positive sciatic nerve tension RLE                      Objective measurements completed on examination: See above findings.               PT Education - 04/10/20 1707    Education Details HEP; DN education    Person(s) Educated Patient    Methods Explanation;Demonstration;Handout     Comprehension Verbalized understanding;Returned demonstration            PT Short Term Goals - 04/10/20 1718      PT SHORT TERM GOAL #1   Title Ind with initial HEP    Time 3    Period Weeks    Status New    Target Date 05/01/20             PT Long Term Goals - 04/10/20 1719      PT LONG TERM GOAL #1   Title Patient to report decreased right leg pain by 75% or more with ADLS.    Time 8    Period Weeks    Status New    Target Date 06/05/20      PT LONG TERM GOAL #2   Title Patient able to sleep without waking from leg pain.    Time 8    Period Weeks    Status New      PT LONG TERM GOAL #3   Title Patient to be independent in HEP for core and LE strengthening to allow for proper body mechanics with ADLS.    Time 8    Period Weeks    Status New      PT LONG TERM GOAL #4   Title Improved FOTO score to <= 32% limitations    Time 8    Period Weeks    Status New      PT LONG TERM GOAL #5   Title Improved BLE strength to 5/5 to help improve body mechanics with ADLS.    Time 8    Period Weeks    Status New                  Plan - 04/10/20 1710    Clinical Impression Statement Patient presents with reports of right sided hip and leg pain beginning in Sep 2020 and worsening now to the point she can not sleep at night and has pain with ADLs. Pain is intermittent and inconsistent with activity. Patient has had PT in the past for this with relief. She also reports  intermittent low back pain with bending and twisting which causes spasms. Special test were negative for disc derangement. She has full lumbar and LE ROM. She has some hip and core weakness and multiple trigger points and tenderness in her right gluteals and low back. She also has pain with PA mobs at L5/S1. She will benefit from PT to decrease pain and increase core and LE strength to prevent further injury.    Personal Factors and Comorbidities Comorbidity 3+;Fitness    Comorbidities HTN, asthma,  migraines, high cholesterol, broken left shoulder (years ago), bil knee pain (arthritis), low back pain    Examination-Activity Limitations Lift;Sleep    Stability/Clinical Decision Making Stable/Uncomplicated    Clinical Decision Making Low    Rehab Potential Excellent    PT Frequency 2x / week    PT Duration 8 weeks    PT Treatment/Interventions ADLs/Self Care Home Management;Cryotherapy;Electrical Stimulation;Moist Heat;Traction;Neuromuscular re-education;Therapeutic exercise;Therapeutic activities;Patient/family education;Manual techniques;Dry needling;Taping;Spinal Manipulations;Joint Manipulations    PT Next Visit Plan Review nerve glide and piriformis stretch; DN/manual  to right hip/back; body mechanics/ADL modifications, core and LE strength    PT Home Exercise Plan [email or web address]    Consulted and Agree with Plan of Care Patient           Patient will benefit from skilled therapeutic intervention in order to improve the following deficits and impairments:  Pain, Increased muscle spasms, Decreased activity tolerance, Impaired flexibility, Postural dysfunction, Decreased strength  Visit Diagnosis: Chronic bilateral low back pain with right-sided sciatica - Plan: PT plan of care cert/re-cert     Problem List Patient Active Problem List   Diagnosis Date Noted  . Gastroesophageal reflux disease 12/31/2018  . Mild persistent asthma, uncomplicated 00/34/9179  . Allergic rhinoconjunctivitis 08/25/2016   Madelyn Flavors PT 04/10/2020, 5:26 PM  [facility] [address] [place], [place], [address] Phone: [phone]   Fax:  [phone]  Name: Regina Rice MRN: [id number] Date of Birth: 08-29-1966

## 2020-04-10 NOTE — Patient Instructions (Signed)
Access Code: [email or web address] URL: [email or web address] Date: 04/10/2020 Prepared by: Almyra Free  Exercises Supine Piriformis Stretch Pulling Heel to Hip - 2 x daily - 7 x weekly - 1 sets - 3 reps - 30-60 sec hold Supine Sciatic Nerve Glide - 2 x daily - 7 x weekly - 1-2 sets - 10 reps - 5 sec hold  Patient Education Trigger Point Dry Needling

## 2020-04-11 ENCOUNTER — Other Ambulatory Visit: Payer: Self-pay

## 2020-04-11 ENCOUNTER — Ambulatory Visit: Payer: 59

## 2020-04-11 DIAGNOSIS — M5441 Lumbago with sciatica, right side: Secondary | ICD-10-CM | POA: Diagnosis not present

## 2020-04-11 DIAGNOSIS — G8929 Other chronic pain: Secondary | ICD-10-CM | POA: Diagnosis not present

## 2020-04-11 NOTE — Therapy (Signed)
[facility] [address] [place], [place], [address] Phone: [phone]   Fax:  [phone]  Physical Therapy Treatment  Patient Details  Name: Regina Rice MRN: [id number] Date of Birth: Jan 21, 1966 Referring Provider (PT): Gaynelle Arabian MD   Encounter Date: 04/11/2020   PT End of Session - 04/11/20 1014    Visit Number 2    Date for PT Re-Evaluation 06/05/20    Authorization Type UMR    PT Start Time 0931    PT Stop Time 1013    PT Time Calculation (min) 42 min    Activity Tolerance Patient tolerated treatment well    Behavior During Therapy [facility] for tasks assessed/performed           Past Medical History:  Diagnosis Date  . Asthma   . Migraines     Past Surgical History:  Procedure Laterality Date  . ADENOIDECTOMY    . DILATION AND CURETTAGE OF UTERUS    . TONSILLECTOMY      There were no vitals filed for this visit.   Subjective Assessment - 04/11/20 0933    Subjective Just here yesterday.  I've tried the exercises.    Currently in Pain? Yes    Pain Score 1     Pain Location Leg    Pain Orientation Right    Pain Descriptors / Indicators Squeezing    Pain Type Chronic pain    Pain Onset More than a month ago    Pain Frequency Intermittent    Aggravating Factors  unsure/inconsistent                             [facility] Adult PT Treatment/Exercise - 04/11/20 0001      Exercises   Exercises Knee/Hip      Knee/Hip Exercises: Stretches   Active Hamstring Stretch 2 reps;Both;20 seconds    Piriformis Stretch Both;2 reps;30 seconds    Other Knee/Hip Stretches nerve glides on the Rt x 10      Manual Therapy   Manual Therapy Soft tissue mobilization    Manual therapy comments Rt gluteals and bil lumbar paraspinals            Trigger Point Dry Needling - 04/11/20 0001    Consent Given? Yes    Education Handout Provided Previously provided    Muscles Treated Back/Hip  Gluteus minimus;Gluteus medius;Piriformis;Lumbar multifidi   Rt gluteals   Gluteus Minimus Response Twitch response elicited;Palpable increased muscle length    Gluteus Medius Response Twitch response elicited;Palpable increased muscle length    Piriformis Response Twitch response elicited;Palpable increased muscle length    Lumbar multifidi Response Twitch response elicited;Palpable increased muscle length                  PT Short Term Goals - 04/10/20 1718      PT SHORT TERM GOAL #1   Title Ind with initial HEP    Time 3    Period Weeks    Status New    Target Date 05/01/20             PT Long Term Goals - 04/10/20 1719      PT LONG TERM GOAL #1   Title Patient to report decreased right leg pain by 75% or more with ADLS.    Time 8    Period Weeks    Status New    Target Date 06/05/20  PT LONG TERM GOAL #2   Title Patient able to sleep without waking from leg pain.    Time 8    Period Weeks    Status New      PT LONG TERM GOAL #3   Title Patient to be independent in HEP for core and LE strengthening to allow for proper body mechanics with ADLS.    Time 8    Period Weeks    Status New      PT LONG TERM GOAL #4   Title Improved FOTO score to <= 32% limitations    Time 8    Period Weeks    Status New      PT LONG TERM GOAL #5   Title Improved BLE strength to 5/5 to help improve body mechanics with ADLS.    Time 8    Period Weeks    Status New                 Plan - 04/11/20 1017    Clinical Impression Statement Pt with first time follow-up after evaluation. Pt has been performing HEP since yesterday.  Pt is performing all HEP correctly. Pt with tension and trigger points in Rt>lt lumbar spine and gluteals and demonstrated improved tissue mobility after manual therapy and dry needling today.  Pt will continue to benefit from skilled PT to address Rt LE radiculopathy, pain and muscle tension.    PT Frequency 2x / week    PT Duration 8 weeks     PT Treatment/Interventions ADLs/Self Care Home Management;Cryotherapy;Electrical Stimulation;Moist Heat;Traction;Neuromuscular re-education;Therapeutic exercise;Therapeutic activities;Patient/family education;Manual techniques;Dry needling;Taping;Spinal Manipulations;Joint Manipulations    PT Next Visit Plan assess response to dry needling and repeat if helpful, body mechanics/ADL modifications    PT Home Exercise Plan [email or web address]    Consulted and Agree with Plan of Care Patient           Patient will benefit from skilled therapeutic intervention in order to improve the following deficits and impairments:  Pain, Increased muscle spasms, Decreased activity tolerance, Impaired flexibility, Postural dysfunction, Decreased strength  Visit Diagnosis: Chronic bilateral low back pain with right-sided sciatica     Problem List Patient Active Problem List   Diagnosis Date Noted  . Gastroesophageal reflux disease 12/31/2018  . Mild persistent asthma, uncomplicated 23/53/6144  . Allergic rhinoconjunctivitis 08/25/2016     Sigurd Sos, PT 04/11/20 10:18 AM  [facility] [address] [place], [place], [address] Phone: [phone]   Fax:  [phone]  Name: Regina Rice MRN: [id number] Date of Birth: 26-Dec-1965

## 2020-04-12 ENCOUNTER — Other Ambulatory Visit (HOSPITAL_COMMUNITY): Payer: Self-pay | Admitting: Obstetrics and Gynecology

## 2020-04-12 DIAGNOSIS — Z79899 Other long term (current) drug therapy: Secondary | ICD-10-CM | POA: Diagnosis not present

## 2020-04-12 DIAGNOSIS — Z309 Encounter for contraceptive management, unspecified: Secondary | ICD-10-CM | POA: Diagnosis not present

## 2020-04-12 DIAGNOSIS — E7800 Pure hypercholesterolemia, unspecified: Secondary | ICD-10-CM | POA: Diagnosis not present

## 2020-04-12 DIAGNOSIS — Z6837 Body mass index (BMI) 37.0-37.9, adult: Secondary | ICD-10-CM | POA: Diagnosis not present

## 2020-04-12 DIAGNOSIS — Z01419 Encounter for gynecological examination (general) (routine) without abnormal findings: Secondary | ICD-10-CM | POA: Diagnosis not present

## 2020-04-13 MED FILL — MOMETASONE FUROATE 50 MCG S: 50 | 30 days supply | Qty: 17 | Fill #1

## 2020-04-16 ENCOUNTER — Ambulatory Visit: Payer: 59

## 2020-04-19 ENCOUNTER — Ambulatory Visit: Payer: 59 | Attending: Internal Medicine

## 2020-04-19 DIAGNOSIS — Z23 Encounter for immunization: Secondary | ICD-10-CM

## 2020-04-19 NOTE — Progress Notes (Signed)
   Covid-19 Vaccination Clinic  Name:  Regina Rice    MRN: [id number] DOB: April 15, 1966  04/19/2020  [name] was observed post Covid-19 immunization for 30 minutes based on pre-vaccination screening without incident. She was provided with Vaccine Information Sheet and instruction to access the V-Safe system.   [name] was instructed to call 911 with any severe reactions post vaccine: Marland Kitchen Difficulty breathing  . Swelling of face and throat  . A fast heartbeat  . A bad rash all over body  . Dizziness and weakness   Immunizations Administered    Name Date Dose VIS Date Route   Pfizer COVID-19 Vaccine 04/19/2020 10:27 AM 0.3 mL 12/21/2018 Intramuscular   Manufacturer: [employer]   Lot: [id number]   [place]: [id number]

## 2020-04-23 ENCOUNTER — Other Ambulatory Visit: Payer: Self-pay

## 2020-04-23 ENCOUNTER — Encounter: Payer: Self-pay | Admitting: Physical Therapy

## 2020-04-23 ENCOUNTER — Ambulatory Visit: Payer: 59 | Admitting: Physical Therapy

## 2020-04-23 DIAGNOSIS — M5441 Lumbago with sciatica, right side: Secondary | ICD-10-CM

## 2020-04-23 DIAGNOSIS — G8929 Other chronic pain: Secondary | ICD-10-CM

## 2020-04-23 NOTE — Therapy (Signed)
[facility] [address], [place], [place], [address] Phone: [phone]   Fax:  [phone]  Physical Therapy Treatment  Patient Details  Name: Regina Rice MRN: [id number] Date of Birth: January 23, 1966 Referring Provider (PT): Gaynelle Arabian MD   Encounter Date: 04/23/2020   PT End of Session - 04/23/20 1252    Visit Number 3    Date for PT Re-Evaluation 06/05/20    Authorization Type UMR    PT Start Time 1102    PT Stop Time 1145    PT Time Calculation (min) 43 min    Activity Tolerance Patient tolerated treatment well    Behavior During Therapy [facility] for tasks assessed/performed           Past Medical History:  Diagnosis Date  . Asthma   . Migraines     Past Surgical History:  Procedure Laterality Date  . ADENOIDECTOMY    . DILATION AND CURETTAGE OF UTERUS    . TONSILLECTOMY      There were no vitals filed for this visit.   Subjective Assessment - 04/23/20 1104    Subjective Very sore after DN last time.  I used heat.  It may have helped.  I use pillows to prop behind my right hip at night to give some relief to Rt leg.    Pertinent History HTN, asthma, migraines, high cholesterol, broken left shoulder (years ago), bil knee pain (arthritis), low back pain    Patient Stated Goals to get rid of pain    Currently in Pain? Yes    Pain Score 2     Pain Location Buttocks    Pain Orientation Right    Pain Descriptors / Indicators Squeezing;Tightness    Pain Type Chronic pain    Pain Radiating Towards to posterior thigh    Pain Onset More than a month ago    Pain Frequency Intermittent                             [facility] Adult PT Treatment/Exercise - 04/23/20 0001      Therapeutic Activites    Therapeutic Activities Other Therapeutic Activities    Other Therapeutic Activities sit to stand, find good standing posture      Exercises   Exercises Knee/Hip;Lumbar      Lumbar  Exercises: Stretches   Hip Flexor Stretch Right;Left;1 rep;30 seconds    Hip Flexor Stretch Limitations foot on 2nd step, bil overhead shoulder flexion    Other Lumbar Stretch Exercise supine nerve glide with ankle pumps x 1'      Lumbar Exercises: Seated   Other Seated Lumbar Exercises TrA isometrics 5x5 sec      Lumbar Exercises: Quadruped   Madcat/Old Horse 10 reps      Knee/Hip Exercises: Seated   Other Seated Knee/Hip Exercises hip flexion isometric seated on black pad 5x5 sec holds    Sit to Sand 5 reps;without UE support   from black pad, PT cued hip hinge and standing alignment, co     Manual Therapy   Manual Therapy Joint mobilization;Manual Traction;Soft tissue mobilization    Joint Mobilization Rt L4-S1 gapping in Lt SL Gr II/III     Soft tissue mobilization Rt gluteals, SI joint region, lumbar multifidus on Rt    Manual Traction prone manual distraction of sacrum from L5 Gr II/III 3x30 sec  Trigger Point Dry Needling - 04/23/20 0001    Consent Given? Yes    Education Handout Provided Previously provided    Muscles Treated Back/Hip Gluteus medius;Piriformis;Lumbar multifidi    Other Dry Needling Rt only, Rt L5/S1 multifidus    Gluteus Medius Response Twitch response elicited;Palpable increased muscle length    Piriformis Response Twitch response elicited;Palpable increased muscle length    Lumbar multifidi Response Twitch response elicited;Palpable increased muscle length                  PT Short Term Goals - 04/23/20 1258      PT SHORT TERM GOAL #1   Title Ind with initial HEP    Status On-going             PT Long Term Goals - 04/10/20 1719      PT LONG TERM GOAL #1   Title Patient to report decreased right leg pain by 75% or more with ADLS.    Time 8    Period Weeks    Status New    Target Date 06/05/20      PT LONG TERM GOAL #2   Title Patient able to sleep without waking from leg pain.    Time 8    Period Weeks    Status  New      PT LONG TERM GOAL #3   Title Patient to be independent in HEP for core and LE strengthening to allow for proper body mechanics with ADLS.    Time 8    Period Weeks    Status New      PT LONG TERM GOAL #4   Title Improved FOTO score to <= 32% limitations    Time 8    Period Weeks    Status New      PT LONG TERM GOAL #5   Title Improved BLE strength to 5/5 to help improve body mechanics with ADLS.    Time 8    Period Weeks    Status New                 Plan - 04/23/20 1253    Clinical Impression Statement Pt arrived with low grade pain but reported signif soreness x several days following last session with DN. She presented today with reports of trunk stiffness vs pain and has had intermittent Rt LE pain to toes.  PT reviewed supine neural glides as Pt has holding prolonged painful stretch vs oscillating via ankle or knee motion.  PT repeated Rt glut med, piriformis and Rt L5/S1 multif DN today as TPs were ongoing.  Signif pain present in Rt piriformis.  PT introduced TrA in sitting and initiated sit to stand with TrA awareness with use of hip hinge.  PT worked on standing posture alignment and gave self-check cues.  Pt has signif thoracic kyphosis and weakness in abdominals and scapulae.  She will benefit from continued body mechanics, postural re-ed and strengthening to reduce pain and improve mobiity.    Comorbidities HTN, asthma, migraines, high cholesterol, broken left shoulder (years ago), bil knee pain (arthritis), low back pain    Rehab Potential Excellent    PT Frequency 2x / week    PT Duration 8 weeks    PT Treatment/Interventions ADLs/Self Care Home Management;Cryotherapy;Electrical Stimulation;Moist Heat;Traction;Neuromuscular re-education;Therapeutic exercise;Therapeutic activities;Patient/family education;Manual techniques;Dry needling;Taping;Spinal Manipulations;Joint Manipulations    PT Next Visit Plan assess DN #2 to Rt hip Rt lumbar, f/u on sit to stand  with standing posture  alignment, supine UE bands, core, spine ROM as tol (try open books    PT Home Exercise Plan [email or web address]    Consulted and Agree with Plan of Care Patient           Patient will benefit from skilled therapeutic intervention in order to improve the following deficits and impairments:     Visit Diagnosis: Chronic bilateral low back pain with right-sided sciatica     Problem List Patient Active Problem List   Diagnosis Date Noted  . Gastroesophageal reflux disease 12/31/2018  . Mild persistent asthma, uncomplicated 84/66/5993  . Allergic rhinoconjunctivitis 08/25/2016    Baruch Merl, PT 04/23/20 12:59 PM   [facility] [address] [place], [place], [address] Phone: [phone]   Fax:  [phone]  Name: Regina Rice MRN: [id number] Date of Birth: 1966-10-04

## 2020-04-25 ENCOUNTER — Ambulatory Visit: Payer: 59 | Admitting: Physical Therapy

## 2020-04-25 ENCOUNTER — Encounter: Payer: Self-pay | Admitting: Physical Therapy

## 2020-04-25 ENCOUNTER — Other Ambulatory Visit: Payer: Self-pay

## 2020-04-25 DIAGNOSIS — G8929 Other chronic pain: Secondary | ICD-10-CM

## 2020-04-25 DIAGNOSIS — M5441 Lumbago with sciatica, right side: Secondary | ICD-10-CM | POA: Diagnosis not present

## 2020-04-25 NOTE — Patient Instructions (Signed)
Access Code: [email or web address] URL: [email or web address]: 06/30/2021Prepared by: Venetia Night BeuhringExercises  Supine Piriformis Stretch Pulling Heel to Hip - 2 x daily - 7 x weekly - 1 sets - 3 reps - 30-60 sec hold  Supine Sciatic Nerve Glide - 2 x daily - 7 x weekly - 1-2 sets - 10 reps - 5 sec hold  Supine Lower Trunk Rotation - 1 x daily - 7 x weekly - 1 sets - 3 reps - 10 hold  Cat-Camel - 1 x daily - 7 x weekly - 2 sets - 10 reps  Hooklying Isometric Hip Flexion - 1 x daily - 7 x weekly - 3 sets - 5 reps - 5 hold  Standing Row with Anchored Resistance - 1 x daily - 7 x weekly - 3 sets - 10 reps  Standing Shoulder Extension with Resistance - 1 x daily - 7 x weekly - 3 sets - 10 reps

## 2020-04-25 NOTE — Therapy (Signed)
[facility] [address], [place], [place], [address] Phone: [phone]   Fax:  [phone]  Physical Therapy Treatment  Patient Details  Name: Regina Rice MRN: [id number] Date of Birth: [birth year] Referring Provider (PT): Gaynelle Arabian MD   Encounter Date: 04/25/2020   PT End of Session - 04/25/20 0849    Visit Number 4    Date for PT Re-Evaluation 06/05/20    Authorization Type UMR    PT Start Time 0845    PT Stop Time 0929    PT Time Calculation (min) 44 min    Activity Tolerance Patient tolerated treatment well    Behavior During Therapy [facility] for tasks assessed/performed           Past Medical History:  Diagnosis Date  . Asthma   . Migraines     Past Surgical History:  Procedure Laterality Date  . ADENOIDECTOMY    . DILATION AND CURETTAGE OF UTERUS    . TONSILLECTOMY      There were no vitals filed for this visit.   Subjective Assessment - 04/25/20 0847    Subjective I get sore after hip DN up into the Rt low back.  I have been trying to think about my standing posture.    Pertinent History HTN, asthma, migraines, high cholesterol, broken left shoulder (years ago), bil knee pain (arthritis), low back pain    Patient Stated Goals to get rid of pain    Currently in Pain? Yes    Pain Score 4     Pain Location Back    Pain Orientation Right;Lower;Lateral    Pain Descriptors / Indicators Squeezing;Tightness    Pain Type Chronic pain    Pain Radiating Towards to buttocks and posterior thigh on Rt    Pain Onset More than a month ago    Pain Frequency Intermittent    Effect of Pain on Daily Activities limits sleep                             [facility] Adult PT Treatment/Exercise - 04/25/20 0001      Exercises   Exercises Shoulder      Lumbar Exercises: Stretches   Active Hamstring Stretch Limitations supine Rt LE sciatic glides with ankle oscillation x 30 reps    Single  Knee to Chest Stretch Right;Left;2 reps;10 seconds    Lower Trunk Rotation 3 reps;10 seconds    Lower Trunk Rotation Limitations bil    Piriformis Stretch Right;Left;1 rep;30 seconds    Gastroc Stretch Right;60 seconds    Gastroc Stretch Limitations oscillating with slant board against wall for nerve glide   diminished intensity over time   Other Lumbar Stretch Exercise seated sciatic nerve glides on black pad in chair with ankle DF and knee oscillations x 1'      Lumbar Exercises: Aerobic   Recumbent Bike L2 x 5', PT present to discuss symptoms      Lumbar Exercises: Sidelying   Clam Both;10 reps    Other Sidelying Lumbar Exercises TrA with self-palpation 5x10 sec holds      Knee/Hip Exercises: Standing   Other Standing Knee Exercises 1.5 min marching on foam bil UE support, PT cued stand tall through hips and use abdominals for alingment posturally      Shoulder Exercises: Standing   Extension Strengthening;Both;20 reps;Theraband    Theraband Level (Shoulder Extension) Level 3 (Green)    Extension Limitations  PT cued standing posture alignement and use of TrA to maintain posture    Row Strengthening;Both;20 reps;Theraband    Theraband Level (Shoulder Row) Level 3 (Green)    Row Limitations PT cued standing posture alignement and use of TrA to maintain posture                  PT Education - 04/25/20 0918    Education Details Access Code: [email or web address]    Person(s) Educated Patient    Methods Explanation;Demonstration;Handout    Comprehension Verbalized understanding;Returned demonstration            PT Short Term Goals - 04/23/20 1258      PT SHORT TERM GOAL #1   Title Ind with initial HEP    Status On-going             PT Long Term Goals - 04/10/20 1719      PT LONG TERM GOAL #1   Title Patient to report decreased right leg pain by 75% or more with ADLS.    Time 8    Period Weeks    Status New    Target Date 06/05/20      PT LONG TERM GOAL #2   Title  Patient able to sleep without waking from leg pain.    Time 8    Period Weeks    Status New      PT LONG TERM GOAL #3   Title Patient to be independent in HEP for core and LE strengthening to allow for proper body mechanics with ADLS.    Time 8    Period Weeks    Status New      PT LONG TERM GOAL #4   Title Improved FOTO score to <= 32% limitations    Time 8    Period Weeks    Status New      PT LONG TERM GOAL #5   Title Improved BLE strength to 5/5 to help improve body mechanics with ADLS.    Time 8    Period Weeks    Status New                 Plan - 04/25/20 5176    Clinical Impression Statement Pt with good response to neural glides on Rt in multiple positions reporting diminishing intensity with repeated reps.  She needs intermittent cueing for standing postural alignment to avoid anterior pelvis and rounded shoulders.  Her core awareness is improving.  PT adjusted piriformis stretch for deeper knee crossing to further engage stretch.  PT with good response to spinal ROM and spinal stretching, reporting less soreness and stiffness end of session.  PT progressed HEP for spinal ROM and postural strength.  Continue along POC.    Comorbidities HTN, asthma, migraines, high cholesterol, broken left shoulder (years ago), bil knee pain (arthritis), low back pain    Stability/Clinical Decision Making Stable/Uncomplicated    Rehab Potential Excellent    PT Frequency 2x / week    PT Treatment/Interventions ADLs/Self Care Home Management;Cryotherapy;Electrical Stimulation;Moist Heat;Traction;Neuromuscular re-education;Therapeutic exercise;Therapeutic activities;Patient/family education;Manual techniques;Dry needling;Taping;Spinal Manipulations;Joint Manipulations    PT Next Visit Plan review updates to HEP last visit, continue neural glides Rt LE, spinal ROM, progress core/postural strength, body mechanics, try open books and foam roller for tspine mobility    PT Home Exercise Plan  [email or web address]    Consulted and Agree with Plan of Care Patient           Patient will benefit from  skilled therapeutic intervention in order to improve the following deficits and impairments:     Visit Diagnosis: Chronic bilateral low back pain with right-sided sciatica     Problem List Patient Active Problem List   Diagnosis Date Noted  . Gastroesophageal reflux disease 12/31/2018  . Mild persistent asthma, uncomplicated 66/03/3015  . Allergic rhinoconjunctivitis 08/25/2016    Baruch Merl, PT 04/25/20 9:30 AM   [facility] [address], [place], [place], [address] Phone: [phone]   Fax:  [phone]  Name: Regina Rice MRN: [id number] Date of Birth: 08-21-1966

## 2020-04-26 ENCOUNTER — Other Ambulatory Visit: Payer: Self-pay | Admitting: Family Medicine

## 2020-04-26 MED FILL — LISINOPRIL 10 MG TABS: 10 | 90 days supply | Qty: 90 | Fill #2

## 2020-04-26 MED FILL — MONTELUKAST SOD 10 MG TAB: 10 | 90 days supply | Qty: 90 | Fill #0

## 2020-05-01 DIAGNOSIS — H5213 Myopia, bilateral: Secondary | ICD-10-CM | POA: Diagnosis not present

## 2020-05-01 DIAGNOSIS — H52203 Unspecified astigmatism, bilateral: Secondary | ICD-10-CM | POA: Diagnosis not present

## 2020-05-01 DIAGNOSIS — H524 Presbyopia: Secondary | ICD-10-CM | POA: Diagnosis not present

## 2020-05-08 ENCOUNTER — Other Ambulatory Visit: Payer: Self-pay

## 2020-05-08 ENCOUNTER — Encounter: Payer: Self-pay | Admitting: Physical Therapy

## 2020-05-08 ENCOUNTER — Ambulatory Visit: Payer: 59 | Attending: Family Medicine | Admitting: Physical Therapy

## 2020-05-08 DIAGNOSIS — M5441 Lumbago with sciatica, right side: Secondary | ICD-10-CM | POA: Diagnosis not present

## 2020-05-08 DIAGNOSIS — G8929 Other chronic pain: Secondary | ICD-10-CM | POA: Diagnosis not present

## 2020-05-08 NOTE — Patient Instructions (Signed)
Access Code: [email or web address] URL: [email or web address] Date: 05/08/2020 Prepared by: Almyra Free  Exercises Supine Piriformis Stretch Pulling Heel to Hip - 2 x daily - 7 x weekly - 1 sets - 3 reps - 30-60 sec hold Supine Sciatic Nerve Glide - 2 x daily - 7 x weekly - 1-2 sets - 10 reps - 5 sec hold Supine Lower Trunk Rotation - 1 x daily - 7 x weekly - 1 sets - 3 reps - 10 hold Cat-Camel - 1 x daily - 7 x weekly - 2 sets - 10 reps Hooklying Isometric Hip Flexion - 1 x daily - 7 x weekly - 3 sets - 5 reps - 5 hold Standing Row with Anchored Resistance - 1 x daily - 7 x weekly - 3 sets - 10 reps Standing Shoulder Extension with Resistance - 1 x daily - 7 x weekly - 3 sets - 10 reps Thoracic Extension Mobilization on Foam Roll - 2 x daily - 7 x weekly - 5 reps - 2 sets Active Hip Flexor Stretch - 2 x daily - 7 x weekly - 1 sets - 10 reps

## 2020-05-08 NOTE — Therapy (Signed)
[facility] [address], [place], [place], [address] Phone: [phone]   Fax:  [phone]  Physical Therapy Treatment  Patient Details  Name: Regina Rice MRN: [id number] Date of Birth: 21-Sep-1966 Referring Provider (PT): Gaynelle Arabian MD   Encounter Date: 05/08/2020   PT End of Session - 05/08/20 1018    Visit Number 5    Date for PT Re-Evaluation 06/05/20    Authorization Type UMR    PT Start Time 1018    PT Stop Time 1059    PT Time Calculation (min) 41 min    Activity Tolerance Patient tolerated treatment well    Behavior During Therapy [facility] for tasks assessed/performed           Past Medical History:  Diagnosis Date  . Asthma   . Migraines     Past Surgical History:  Procedure Laterality Date  . ADENOIDECTOMY    . DILATION AND CURETTAGE OF UTERUS    . TONSILLECTOMY      There were no vitals filed for this visit.   Subjective Assessment - 05/08/20 1020    Subjective Patient noting improvement with standing due to adjusting posture.    Pertinent History HTN, asthma, migraines, high cholesterol, broken left shoulder (years ago), bil knee pain (arthritis), low back pain    Patient Stated Goals to get rid of pain    Currently in Pain? Yes    Pain Score 3     Pain Location Back    Pain Orientation Right;Lower;Lateral    Pain Descriptors / Indicators Tightness    Pain Type Chronic pain    Pain Radiating Towards into buttocks and post thigh    Pain Onset More than a month ago                             [facility] Adult PT Treatment/Exercise - 05/08/20 0001      Lumbar Exercises: Stretches   Active Hamstring Stretch Limitations supine Rt LE sciatic glides with ankle oscillation x 15 reps with 5 sec hold    Lower Trunk Rotation 2 reps;30 seconds    Lower Trunk Rotation Limitations left with right leg crossed over left    Hip Flexor Stretch Right;Left;2 reps;30 seconds    Hip  Flexor Stretch Limitations warrior with SB left for right QL and Warrior straight with left leg back    Piriformis Stretch Right;Left;1 rep;30 seconds      Lumbar Exercises: Aerobic   Recumbent Bike L3 x 6.5 min Pt present to assess status      Lumbar Exercises: Standing   Functional Squats Limitations mini squat with bil shoulder ext x 10 sec to OH flexion x 10 sec 2 reps    Other Standing Lumbar Exercises mini squat with buttocks on wall with trunk ext x 10; dead lift with 5# bil x 5 then with no wt to correct form    Other Standing Lumbar Exercises Wall angels x 5; OH flexion x 5 against wall      Lumbar Exercises: Supine   Other Supine Lumbar Exercises thoracic extension over 1/2 foam roller multiple levels x 10-20 sec hold    Other Supine Lumbar Exercises lumbar rolling on full hard foam roller for spinal mobilization                  PT Education - 05/08/20 1100    Education Details HEP progressed  Person(s) Educated Patient    Methods Explanation;Demonstration;Handout    Comprehension Verbalized understanding;Returned demonstration            PT Short Term Goals - 05/08/20 1024      PT SHORT TERM GOAL #1   Title Ind with initial HEP    Status Achieved             PT Long Term Goals - 05/08/20 1024      PT LONG TERM GOAL #2   Title Patient able to sleep without waking from leg pain.    Baseline is taking meds less often before bed    Status On-going                 Plan - 05/08/20 1358    Clinical Impression Statement Patient complaining of right QL pain today. We added standing warrior stretch with SB to address this and also worked on low back strengthening in standing with squats and dead lifts. Patient tolerated well but has diffiulty maintaing good form with dead lift due thoracic kyphosis. Thoracic extension introduced over 1/2 foam roller with good response. Patient is progressing with goals.    Personal Factors and Comorbidities  Comorbidity 3+;Fitness    Comorbidities HTN, asthma, migraines, high cholesterol, broken left shoulder (years ago), bil knee pain (arthritis), low back pain    Examination-Activity Limitations Lift;Sleep    PT Frequency 2x / week    PT Duration 8 weeks    PT Treatment/Interventions ADLs/Self Care Home Management;Cryotherapy;Electrical Stimulation;Moist Heat;Traction;Neuromuscular re-education;Therapeutic exercise;Therapeutic activities;Patient/family education;Manual techniques;Dry needling;Taping;Spinal Manipulations;Joint Manipulations    PT Next Visit Plan review updates to HEP last visit, continue neural glides Rt LE, spinal ROM, progress core/postural strength, body mechanics, try open books for tspine mobility    PT Home Exercise Plan [email or web address]    Consulted and Agree with Plan of Care Patient           Patient will benefit from skilled therapeutic intervention in order to improve the following deficits and impairments:  Pain, Increased muscle spasms, Decreased activity tolerance, Impaired flexibility, Postural dysfunction, Decreased strength  Visit Diagnosis: Chronic bilateral low back pain with right-sided sciatica     Problem List Patient Active Problem List   Diagnosis Date Noted  . Gastroesophageal reflux disease 12/31/2018  . Mild persistent asthma, uncomplicated 50/53/9767  . Allergic rhinoconjunctivitis 08/25/2016   Madelyn Flavors PT 05/08/2020, 2:07 PM  [facility] [address] [place], [place], [address] Phone: [phone]   Fax:  [phone]  Name: Reeva Davern MRN: [id number] Date of Birth: 1966-03-12

## 2020-05-09 ENCOUNTER — Encounter: Payer: Self-pay | Admitting: Allergy

## 2020-05-09 ENCOUNTER — Ambulatory Visit: Payer: 59 | Admitting: Allergy

## 2020-05-09 ENCOUNTER — Other Ambulatory Visit: Payer: Self-pay

## 2020-05-09 VITALS — BP 118/80 | HR 69 | Resp 18 | Ht 69.0 in

## 2020-05-09 DIAGNOSIS — H1013 Acute atopic conjunctivitis, bilateral: Secondary | ICD-10-CM

## 2020-05-09 DIAGNOSIS — K2100 Gastro-esophageal reflux disease with esophagitis, without bleeding: Secondary | ICD-10-CM | POA: Diagnosis not present

## 2020-05-09 DIAGNOSIS — J3089 Other allergic rhinitis: Secondary | ICD-10-CM

## 2020-05-09 DIAGNOSIS — J4530 Mild persistent asthma, uncomplicated: Secondary | ICD-10-CM

## 2020-05-09 NOTE — Progress Notes (Signed)
Follow-up Note  RE: Regina Rice MRN: [id number] DOB: 1966-10-05 Date of Office Visit: 05/09/2020   History of present illness: Regina Rice is a 54 y.o. female presenting today for follow-up of asthma, allergic rhinoconjunctivitis and post-COVID infection. She was last seen in the office on 12/29/2019 by Dr. Nelva Bush.  H&P obtained by Dr. Wynetta Emery, [facility] medicine resident and reviewed by myself.    Overall, since her last visit she states that she feels much better and is coughing less frequently. She successsfully discontinued the Qvar and started Symbicort 2 puffs twice daily which she claims has helped her breathing. She has stopped needing to use her home albuterol nebulizer entirely. 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 Additionally, her allergy symptoms have been appropriately controlled with multiple nasal sprays including astelin twice daily, atrovent in the morning and nasonex in the morning. She also has been taking allegra and singulair at night, as prescribed. For the past week, she started taking Mucinex in the morning every day which she has done in the past whenever she feels that her allergies may be worsening. Regarding her past COVID-19 infection, she continues to endorse a lack of taste and smell, however she states that this is improving slowly.  Review of systems: Review of Systems  Constitutional: Negative.   HENT:       See HPI  Eyes: Negative.   Respiratory:       See HPI  Cardiovascular: Negative.   Gastrointestinal: Negative.   Musculoskeletal: Negative.   Skin: Negative.     All other systems negative unless noted above in HPI  Past medical/social/surgical/family history have been reviewed and are unchanged unless specifically indicated below.  No changes  Medication List: Current Outpatient Medications  Medication Sig  Dispense Refill   albuterol (PROVENTIL) (2.5 MG/3ML) 0.083% nebulizer solution Take 3 mLs (2.5 mg total) by nebulization every 4 (four) hours as needed for wheezing or shortness of breath. 150 mL 1   Albuterol Sulfate (PROAIR RESPICLICK) 270 (90 Base) MCG/ACT AEPB Take 1 each by mouth daily as needed. 1 each 1   aspirin 81 MG tablet Take 81 mg by mouth daily.     azelastine (ASTELIN) 0.1 % nasal spray USE 2 SPRAYS IN EACH NOSTRIL TWICE A DAY AS DIRECTED 30 mL 4   famotidine (PEPCID) 20 MG tablet Take 1 tablet (20 mg total) by mouth 2 (two) times daily. 60 tablet 5   fexofenadine (ALLEGRA) 180 MG tablet Take 180 mg by mouth daily.     glucosamine-chondroitin 500-400 MG tablet Take 1 tablet by mouth 2 (two) times daily.     ipratropium (ATROVENT) 0.06 % nasal spray Place 2 sprays into both nostrils 3 (three) times daily. 15 mL 5   lisinopril (ZESTRIL) 10 MG tablet      mometasone (NASONEX) 50 MCG/ACT nasal spray INHALE 2 SPRAYS INTO EACH NOSTRIL DAILY 17 g 5   montelukast (SINGULAIR) 10 MG tablet TAKE 1 TABLET BY MOUTH AT BEDTIME 90 tablet 1   Multiple Vitamin (MULTI-VITAMIN PO) Take by mouth.     nitroGLYCERIN (NITRODUR - DOSED IN MG/24 HR) 0.2 mg/hr patch      rosuvastatin (CRESTOR) 10 MG tablet Take by mouth.     SYMBICORT 160-4.5 MCG/ACT inhaler INHALE 2 PUFF INTO THE LUNGS TWO TIMES DAILY 10.2 g 5   TRI-PREVIFEM 0.18/0.215/0.25 MG-35 MCG  tablet   2   No current facility-administered medications for this visit.     Known medication allergies: Allergies  Allergen Reactions   Septra [Sulfamethoxazole-Trimethoprim] Swelling   Amoxicillin Rash     Physical examination: Blood pressure 118/80, pulse 69, resp. rate 18, height 5\' 9"  (1.753 m), SpO2 96 %.  General: Alert, interactive, in no acute distress. HEENT: TMs pearly gray, turbinates minimally edematous without discharge, post-pharynx non erythematous. Neck: Supple without lymphadenopathy. Lungs: Clear to  auscultation without wheezing, rhonchi or rales. {no increased work of breathing. CV: Normal S1, S2 without murmurs. Abdomen: Nondistended, nontender. Skin: Warm and dry, without lesions or rashes. Extremities:  No clubbing, cyanosis or edema. Neuro:   Grossly intact.  Diagnositics/Labs:  Spirometry: FEV1: 3.52, FVC: 4.13, ratio consistent with normal ventilatory function.  Assessment and plan:   Mild persistent asthma  - improved  - continue Symbicort 126mcg 2 puffs twice a day.  Samples provided in office  - ProAir Respiclick 2 puffs or albuterol 1 vial via nebulizer every 4 hours as needed for cough, wheeze, shortness of breath, chest tightness.  Use either nebulizer or inhaler at a time.    Allergic rhinoconjunctivitis  - Continue Astepro 2 sprays twice daily for nasal drainage.  - Continue Nasonex 2 sprays daily in AM for nasal congestion  - will prescribe nasal Atrovent 2 sprays each nostril up to 3-4 times a day as needed for nasal drainage  - Continue Singulair each evening.  - Continue allegra 180mg  in PM  - nasal saline rinses once a day at night.  Use this before medicated nasal sprays.    Reflux  - continue famotidine 20 mg twice a day to decrease reflux  Follow-up 4-6 months or sooner if needed.  I appreciate the opportunity to take part in Neilah's care. Please do not hesitate to contact me with questions.  Sincerely,  Paulla Dolly, MD PGY-1, Internal Medicine  Attestation: I performed H&P of the patient and discussed management with the resident. I reviewed the residents note and agree with the documented findings and plan of care. The note in its entirety was edited by myself, including the physical exam, assessment, and plan.    Prudy Feeler, MD [facility]

## 2020-05-09 NOTE — Patient Instructions (Signed)
Mild persistent asthma  - improved  - continue Symbicort 161mcg 2 puffs twice a day.  Samples provided in office  - ProAir Respiclick 2 puffs or albuterol 1 vial via nebulizer every 4 hours as needed for cough, wheeze, shortness of breath, chest tightness.  Use either nebulizer or inhaler at a time.    Allergic rhinoconjunctivitis  - Continue Astepro 2 sprays twice daily for nasal drainage.  - Continue Nasonex 2 sprays daily in AM for nasal congestion  - will prescribe nasal Atrovent 2 sprays each nostril up to 3-4 times a day as needed for nasal drainage  - Continue Singulair each evening.  - Continue allegra 180mg  in PM  - nasal saline rinses once a day at night.  Use this before medicated nasal sprays.    Reflux  - continue famotidine 20 mg twice a day to decrease reflux  Follow-up 4-6 months or sooner if needed.

## 2020-05-10 ENCOUNTER — Ambulatory Visit: Payer: 59

## 2020-05-10 DIAGNOSIS — M5441 Lumbago with sciatica, right side: Secondary | ICD-10-CM | POA: Diagnosis not present

## 2020-05-10 DIAGNOSIS — G8929 Other chronic pain: Secondary | ICD-10-CM | POA: Diagnosis not present

## 2020-05-10 NOTE — Therapy (Signed)
[facility] [address] [place], [place], [address] Phone: [phone]   Fax:  [phone]  Physical Therapy Treatment  Patient Details  Name: Regina Rice MRN: [id number] Date of Birth: December 10, 1965 Referring Provider (PT): Gaynelle Arabian MD   Encounter Date: 05/10/2020   PT End of Session - 05/10/20 0930    Visit Number 6    Date for PT Re-Evaluation 06/05/20    Authorization Type UMR    PT Start Time 0850    PT Stop Time 0930    PT Time Calculation (min) 40 min    Activity Tolerance Patient tolerated treatment well    Behavior During Therapy [facility] for tasks assessed/performed           Past Medical History:  Diagnosis Date  . Asthma   . Migraines     Past Surgical History:  Procedure Laterality Date  . ADENOIDECTOMY    . DILATION AND CURETTAGE OF UTERUS    . TONSILLECTOMY      There were no vitals filed for this visit.   Subjective Assessment - 05/10/20 0852    Subjective My Rt leg pain is 50% better with reduced frequency.    Currently in Pain? Yes    Pain Score 3     Pain Location Back    Pain Orientation Right;Lower    Pain Descriptors / Indicators Tightness                             [facility] Adult PT Treatment/Exercise - 05/10/20 0001      Lumbar Exercises: Stretches   Active Hamstring Stretch Right;3 reps;30 seconds    Hip Flexor Stretch Right;Left;2 reps;30 seconds    Hip Flexor Stretch Limitations warrior with SB left for right QL and Warrior straight with left leg back      Lumbar Exercises: Aerobic   Recumbent Bike L3 x 7 min Pt present to assess status      Lumbar Exercises: Sidelying   Clam Both;20 reps    Other Sidelying Lumbar Exercises open book x10 each                  PT Education - 05/10/20 0927    Education Details Access Code: [email or web address]    Person(s) Educated Patient    Methods Explanation;Demonstration;Handout    Comprehension  Verbalized understanding;Returned demonstration            PT Short Term Goals - 05/10/20 0851      PT SHORT TERM GOAL #1   Title Ind with initial HEP    Status Achieved             PT Long Term Goals - 05/10/20 0851      PT LONG TERM GOAL #2   Title Patient able to sleep without waking from leg pain.    Time 8    Period Weeks    Status On-going                 Plan - 05/10/20 8756    Clinical Impression Statement Patient complaining of right QL/side pain today and this has been aggravated for a week. PT added open book stretch and pt is previously performing standing warrior stretch with sidebending to stretch this area.   Pt reports 50% reduction in Rt LE pain since the start of care.  Pt demonstrated Rt LE fatigue on the bike today.  Pt tolerated  strength and flexibility well today and required minor verbal and tactile cues for abdominal engagement and positioning.  Pt will continue to benefit from skilled PT to address Rt LE pain, flexibility and core strength.    PT Treatment/Interventions ADLs/Self Care Home Management;Cryotherapy;Electrical Stimulation;Moist Heat;Traction;Neuromuscular re-education;Therapeutic exercise;Therapeutic activities;Patient/family education;Manual techniques;Dry needling;Taping;Spinal Manipulations;Joint Manipulations    PT Next Visit Plan continue flexibility, strength and core    PT Home Exercise Plan [email or web address]    Consulted and Agree with Plan of Care Patient           Patient will benefit from skilled therapeutic intervention in order to improve the following deficits and impairments:  Pain, Increased muscle spasms, Decreased activity tolerance, Impaired flexibility, Postural dysfunction, Decreased strength  Visit Diagnosis: Chronic bilateral low back pain with right-sided sciatica     Problem List Patient Active Problem List   Diagnosis Date Noted  . Gastroesophageal reflux disease 12/31/2018  . Mild persistent asthma,  uncomplicated 99/35/7017  . Allergic rhinoconjunctivitis 08/25/2016     Sigurd Sos, PT 05/10/20 9:33 AM  [facility] [address] [place], [place], [address] Phone: [phone]   Fax:  [phone]  Name: Ladawna Walgren MRN: [id number] Date of Birth: 05-16-1966

## 2020-05-10 NOTE — Patient Instructions (Signed)
Access Code: [email or web address] URL: [email or web address] Date: 05/10/2020 Prepared by: Claiborne Billings   Sidelying Open Book Thoracic Rotation with Knee on Foam Roll - 2 x daily - 7 x weekly - 1 sets - 10 reps

## 2020-05-10 NOTE — Addendum Note (Signed)
Addended by: Guy Franco on: 05/10/2020 06:26 PM   Modules accepted: Orders

## 2020-05-11 MED FILL — IPRATROPIUM 0.06% SPRAY: 0.06 | 13 days supply | Qty: 15 | Fill #3

## 2020-05-11 MED FILL — SYMBICORT 160-4.5 MCG INH: 160-4.5 | 30 days supply | Qty: 10 | Fill #3

## 2020-05-11 MED FILL — MOMETASONE FUROATE 50 MCG S: 50 | 30 days supply | Qty: 17 | Fill #2

## 2020-05-11 MED FILL — ROSUVASTATIN CALCIUM 10 MG: 10 | 90 days supply | Qty: 90 | Fill #1

## 2020-05-11 MED FILL — AZELASTINE HCL 137 MCG/SPRA: 137 | 25 days supply | Qty: 30 | Fill #2

## 2020-05-14 ENCOUNTER — Other Ambulatory Visit: Payer: Self-pay

## 2020-05-14 ENCOUNTER — Ambulatory Visit: Payer: 59

## 2020-05-14 DIAGNOSIS — G8929 Other chronic pain: Secondary | ICD-10-CM

## 2020-05-14 DIAGNOSIS — M5441 Lumbago with sciatica, right side: Secondary | ICD-10-CM | POA: Diagnosis not present

## 2020-05-14 NOTE — Therapy (Signed)
[facility] [address] [place], [place], [address] Phone: [phone]   Fax:  [phone]  Physical Therapy Treatment  Patient Details  Name: Regina Rice MRN: [id number] Date of Birth: [birth year] Referring Provider (PT): Gaynelle Arabian MD   Encounter Date: 05/14/2020   PT End of Session - 05/14/20 1145    Visit Number 7    Date for PT Re-Evaluation 06/05/20    Authorization Type UMR    PT Start Time 1102    PT Stop Time 1146    PT Time Calculation (min) 44 min    Activity Tolerance Patient tolerated treatment well    Behavior During Therapy [employer] for tasks assessed/performed           Past Medical History:  Diagnosis Date  . Asthma   . Migraines     Past Surgical History:  Procedure Laterality Date  . ADENOIDECTOMY    . DILATION AND CURETTAGE OF UTERUS    . TONSILLECTOMY      There were no vitals filed for this visit.   Subjective Assessment - 05/14/20 1102    Subjective I continue to feel 50% better.  I painted over the weekend so I am sore all over.    Currently in Pain? No/denies    Pain Score 0-No pain                             [facility] Adult PT Treatment/Exercise - 05/14/20 0001      Lumbar Exercises: Stretches   Active Hamstring Stretch Right;3 reps;30 seconds      Lumbar Exercises: Aerobic   Recumbent Bike L3 x 8 min Pt present to assess status      Lumbar Exercises: Sidelying   Clam Both;20 reps    Other Sidelying Lumbar Exercises open book x10 each      Knee/Hip Exercises: Stretches   Active Hamstring Stretch 2 reps;Both;20 seconds    Piriformis Stretch Both;2 reps;30 seconds      Knee/Hip Exercises: Seated   Sit to Sand 2 sets;10 reps;with UE support   seated on black pad     Knee/Hip Exercises: Supine   Bridges Strengthening;Both;2 sets;10 reps                    PT Short Term Goals - 05/10/20 0851      PT SHORT TERM GOAL #1   Title  Ind with initial HEP    Status Achieved             PT Long Term Goals - 05/14/20 1105      PT LONG TERM GOAL #1   Title Patient to report decreased right leg pain by 75% or more with ADLS.    Baseline 50%    Time 8    Period Weeks    Status On-going                 Plan - 05/14/20 1109    Clinical Impression Statement Pt with overall soreness after painting all weekend.   Pt reports 50% reduction in Rt LE pain since the start of care.  Pt is sleeping better without leg pain at night.  Pt tolerated strength and flexibility well today and required minor verbal and tactile cues for abdominal engagement and positioning.  Pt will continue to benefit from skilled PT to address Rt LE pain, flexibility and core strength.  PT Frequency 2x / week    PT Duration 8 weeks    PT Treatment/Interventions ADLs/Self Care Home Management;Cryotherapy;Electrical Stimulation;Moist Heat;Traction;Neuromuscular re-education;Therapeutic exercise;Therapeutic activities;Patient/family education;Manual techniques;Dry needling;Taping;Spinal Manipulations;Joint Manipulations    PT Next Visit Plan continue flexibility, strength and core.  Rt LE strength progression.  FOTO next    PT Home Exercise Plan [email or web address]    Recommended Other Services initial cert is signed    Consulted and Agree with Plan of Care Patient           Patient will benefit from skilled therapeutic intervention in order to improve the following deficits and impairments:  Pain, Increased muscle spasms, Decreased activity tolerance, Impaired flexibility, Postural dysfunction, Decreased strength  Visit Diagnosis: Chronic bilateral low back pain with right-sided sciatica     Problem List Patient Active Problem List   Diagnosis Date Noted  . Gastroesophageal reflux disease 12/31/2018  . Mild persistent asthma, uncomplicated 23/34/3568  . Allergic rhinoconjunctivitis 08/25/2016     Sigurd Sos, PT 05/14/20 11:52 AM  [facility] [address] [place], [place], [address] Phone: [phone]   Fax:  [phone]  Name: Regina Rice MRN: [id number] Date of Birth: 1966-07-09

## 2020-05-16 ENCOUNTER — Other Ambulatory Visit: Payer: Self-pay

## 2020-05-16 ENCOUNTER — Ambulatory Visit: Payer: 59

## 2020-05-16 DIAGNOSIS — M5441 Lumbago with sciatica, right side: Secondary | ICD-10-CM | POA: Diagnosis not present

## 2020-05-16 DIAGNOSIS — G8929 Other chronic pain: Secondary | ICD-10-CM

## 2020-05-16 NOTE — Therapy (Signed)
[facility] [address], [place], [place], [address] Phone: [phone]   Fax:  [phone]  Physical Therapy Treatment  Patient Details  Name: Glora Hulgan MRN: [id number] Date of Birth: [birth year] Referring Provider (PT): Gaynelle Arabian MD   Encounter Date: 05/16/2020   PT End of Session - 05/16/20 1103    Visit Number 8    Date for PT Re-Evaluation 06/05/20    Authorization Type UMR    PT Start Time 1017    PT Stop Time 1101    PT Time Calculation (min) 44 min    Activity Tolerance Patient tolerated treatment well    Behavior During Therapy [facility] for tasks assessed/performed           Past Medical History:  Diagnosis Date  . Asthma   . Migraines     Past Surgical History:  Procedure Laterality Date  . ADENOIDECTOMY    . DILATION AND CURETTAGE OF UTERUS    . TONSILLECTOMY      There were no vitals filed for this visit.   Subjective Assessment - 05/16/20 1026    Subjective I continue to feel 50% better.  I painted over the weekend so I am still sore all over.    Currently in Pain? Yes    Pain Score 4     Pain Location Back    Pain Orientation Right;Lower    Pain Descriptors / Indicators Tightness    Pain Type Chronic pain              [facility] PT Assessment - 05/16/20 0001      Observation/Other Assessments   Focus on Therapeutic Outcomes (FOTO)  28% limitation                         [facility] Adult PT Treatment/Exercise - 05/16/20 0001      Lumbar Exercises: Stretches   Piriformis Stretch 2 reps;20 seconds    Piriformis Stretch Limitations seated and supine with diagonal knee to chest      Lumbar Exercises: Aerobic   Recumbent Bike L3 x 10 min Pt present to assess status   good endurance with this      Knee/Hip Exercises: Seated   Sit to Sand 2 sets;10 reps;with UE support   seated on black pad     Knee/Hip Exercises: Supine   Bridges Strengthening;Both;2 sets;10  reps    Bridges Limitations with ball squeeze    Other Supine Knee/Hip Exercises abdominal bracing with single leg abduction with blue loop band around knees.  2x15.  Good control with antirotation                    PT Short Term Goals - 05/10/20 0851      PT SHORT TERM GOAL #1   Title Ind with initial HEP    Status Achieved             PT Long Term Goals - 05/16/20 1104      PT LONG TERM GOAL #4   Title Improved FOTO score to <= 32% limitations    Status Achieved                 Plan - 05/16/20 1044    Clinical Impression Statement Pt reports 50% reduction in Rt LE pain since the start of care.  Pt is sleeping better without leg pain at night.  Pt tolerated strength and flexibility well  today and required minor verbal and tactile cues for abdominal engagement and positioning. Pt demonstrated improved technique and control with sit to stand today and FOTO is improved from 39% limitation to 28% limitation (goal met).  Pt will continue to benefit from skilled PT to address Rt LE pain, flexibility and core strength.    PT Frequency 2x / week    PT Duration 8 weeks    PT Treatment/Interventions ADLs/Self Care Home Management;Cryotherapy;Electrical Stimulation;Moist Heat;Traction;Neuromuscular re-education;Therapeutic exercise;Therapeutic activities;Patient/family education;Manual techniques;Dry needling;Taping;Spinal Manipulations;Joint Manipulations    PT Next Visit Plan continue flexibility, strength and core.  Rt LE strength progression.  2 more sessions with D/C to HEP    PT Home Exercise Plan [email or web address]    Consulted and Agree with Plan of Care Patient           Patient will benefit from skilled therapeutic intervention in order to improve the following deficits and impairments:  Pain, Increased muscle spasms, Decreased activity tolerance, Impaired flexibility, Postural dysfunction, Decreased strength  Visit Diagnosis: Chronic bilateral low back pain with  right-sided sciatica     Problem List Patient Active Problem List   Diagnosis Date Noted  . Gastroesophageal reflux disease 12/31/2018  . Mild persistent asthma, uncomplicated 84/12/3531  . Allergic rhinoconjunctivitis 08/25/2016    Sigurd Sos, PT 05/16/20 11:05 AM  [facility] [address], [place], [place], [address] Phone: [phone]   Fax:  [phone]  Name: Mistee Soliman MRN: [id number] Date of Birth: 1966-02-16

## 2020-05-21 ENCOUNTER — Ambulatory Visit: Payer: 59 | Admitting: Physical Therapy

## 2020-05-21 ENCOUNTER — Other Ambulatory Visit: Payer: Self-pay

## 2020-05-21 ENCOUNTER — Encounter: Payer: Self-pay | Admitting: Physical Therapy

## 2020-05-21 DIAGNOSIS — G8929 Other chronic pain: Secondary | ICD-10-CM

## 2020-05-21 DIAGNOSIS — M5441 Lumbago with sciatica, right side: Secondary | ICD-10-CM | POA: Diagnosis not present

## 2020-05-21 NOTE — Therapy (Signed)
[facility] [address] [place], [place], [address] Phone: [phone]   Fax:  [phone]  Physical Therapy Treatment  Patient Details  Name: Regina Rice MRN: [id number] Date of Birth: July 01, 1966 Referring Provider (PT): Gaynelle Arabian MD   Encounter Date: 05/21/2020   PT End of Session - 05/21/20 0940    Visit Number 9    Date for PT Re-Evaluation 06/05/20    Authorization Type UMR    PT Start Time 0930    PT Stop Time 1015    PT Time Calculation (min) 45 min    Activity Tolerance Patient tolerated treatment well    Behavior During Therapy [facility] for tasks assessed/performed           Past Medical History:  Diagnosis Date   Asthma    Migraines     Past Surgical History:  Procedure Laterality Date   ADENOIDECTOMY     DILATION AND CURETTAGE OF UTERUS     TONSILLECTOMY      There were no vitals filed for this visit.   Subjective Assessment - 05/21/20 0933    Subjective I am really liking my HEP and the packet of exercises really helps.  Especially the stretches.  I have been able to help my daughter move without pain.  I am sleeping better.    Pertinent History HTN, asthma, migraines, high cholesterol, broken left shoulder (years ago), bil knee pain (arthritis), low back pain    Patient Stated Goals to get rid of pain    Currently in Pain? No/denies    Pain Type Chronic pain    Pain Onset More than a month ago    Pain Frequency Intermittent    Pain Relieving Factors HEP, stretches                             [facility] Adult PT Treatment/Exercise - 05/21/20 0001      Therapeutic Activites    Other Therapeutic Activities stagger stance bending, sit in higher chair at preschool vs low kid chair, use core when bending and use hip hinge to avoid bending through back      Lumbar Exercises: Stretches   Other Lumbar Stretch Exercise warrior 1 with SB 1x20 bil      Lumbar  Exercises: Aerobic   Recumbent Bike L3 x 10 min Pt present to assess status   PT reviewed progress and LTGs     Lumbar Exercises: Supine   Isometric Hip Flexion 5 reps;5 seconds      Lumbar Exercises: Quadruped   Madcat/Old Horse 10 reps    Madcat/Old Horse Limitations PT cued thoracic ext with sag component    Other Quadruped Lumbar Exercises stagger stance counter plank on elbows trunk at 45 deg      Knee/Hip Exercises: Stretches   Piriformis Stretch Both;2 reps;30 seconds    Piriformis Stretch Limitations seated fig 4 and seated knee hug 1 each      Knee/Hip Exercises: Seated   Other Seated Knee/Hip Exercises seated hip flexion isometric 5x5 sec from black pad    Sit to Sand 2 sets;5 reps;without UE support   seated on black pad     Knee/Hip Exercises: Supine   Bridges Strengthening;Both;10 reps    Bridges Limitations with ball squeeze (handout given)    Other Supine Knee/Hip Exercises abdominal bracing with single leg abduction with blue loop band around knees.  2x15.  Good control  with antirotation                    PT Short Term Goals - 05/10/20 0851      PT SHORT TERM GOAL #1   Title Ind with initial HEP    Status Achieved             PT Long Term Goals - 05/21/20 3664      PT LONG TERM GOAL #1   Title Patient to report decreased right leg pain by 75% or more with ADLS.    Status Achieved      PT LONG TERM GOAL #2   Title Patient able to sleep without waking from leg pain.    Baseline rarely awakens with leg pain    Status Achieved      PT LONG TERM GOAL #3   Title Patient to be independent in HEP for core and LE strengthening to allow for proper body mechanics with ADLS.    Status Achieved      PT LONG TERM GOAL #4   Title Improved FOTO score to <= 32% limitations    Baseline 28%    Status Achieved      PT LONG TERM GOAL #5   Title Improved BLE strength to 5/5 to help improve body mechanics with ADLS.    Status Achieved                  Plan - 05/21/20 1254    Clinical Impression Statement Pt arrived without pain and good compliance with HEP.  Her activity level has increased without Rt LE pain.  She finds good relief with spinal ROM and stretches.  She has met all LTGs and reported readiness for d/c today.  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  PT also advised Pt avoid sitting in the low kid chairs whenever possible.  D/C to HEP with f/u as needed.    Comorbidities HTN, asthma, migraines, high cholesterol, broken left shoulder (years ago), bil knee pain (arthritis), low back pain    PT Frequency 2x / week    PT Duration 8 weeks    PT Treatment/Interventions ADLs/Self Care Home Management;Cryotherapy;Electrical Stimulation;Moist Heat;Traction;Neuromuscular re-education;Therapeutic exercise;Therapeutic activities;Patient/family education;Manual techniques;Dry needling;Taping;Spinal Manipulations;Joint Manipulations    PT Next Visit Plan d/c to HEP with f/u as needed    PT Home Exercise Plan [id number]    Consulted and Agree with Plan of Care Patient           Patient will benefit from skilled therapeutic intervention in order to improve the following deficits and impairments:  Pain, Increased muscle spasms, Decreased activity tolerance, Impaired flexibility, Postural dysfunction, Decreased strength  Visit Diagnosis: Chronic bilateral low back pain with right-sided sciatica     Problem List Patient Active Problem List   Diagnosis Date Noted   Gastroesophageal reflux disease 12/31/2018   Mild persistent asthma, uncomplicated 40/34/7425   Allergic rhinoconjunctivitis 08/25/2016    PHYSICAL THERAPY DISCHARGE SUMMARY  Visits from Start of Care: 9  Current functional level related to goals / functional outcomes: See above.   Remaining deficits: See above   Education /  Equipment: HEP Plan: Patient agrees to discharge.  Patient goals were met. Patient is being discharged due to meeting the stated rehab goals.  ?????         Baruch Merl, PT 05/21/20 12:59 PM   [facility] [address], [place], [place], [address] Phone: [phone]   Fax:  [phone]  Name: Shaquan Puerta MRN: [id number] Date of Birth: Jan 08, 1966

## 2020-05-23 ENCOUNTER — Ambulatory Visit: Payer: 59 | Admitting: Physical Therapy

## 2020-05-31 MED FILL — NORGESTIM-ETH ESTRAD TRIPHA: 0.18/0.215/ | 84 days supply | Qty: 84 | Fill #0

## 2020-06-18 MED FILL — IPRATROPIUM 0.06% SPRAY: 0.06 | 13 days supply | Qty: 15 | Fill #4

## 2020-06-18 MED FILL — AZELASTINE HCL 137 MCG/SPRA: 137 | 25 days supply | Qty: 30 | Fill #3

## 2020-06-18 MED FILL — MOMETASONE FUROATE 50 MCG S: 50 | 30 days supply | Qty: 17 | Fill #3

## 2020-06-21 ENCOUNTER — Ambulatory Visit: Payer: 59 | Admitting: Allergy

## 2020-06-21 ENCOUNTER — Encounter: Payer: Self-pay | Admitting: Allergy

## 2020-06-21 ENCOUNTER — Other Ambulatory Visit: Payer: Self-pay

## 2020-06-21 VITALS — BP 110/60 | HR 82 | Temp 98.2°F | Resp 18 | Ht 71.0 in

## 2020-06-21 DIAGNOSIS — J4540 Moderate persistent asthma, uncomplicated: Secondary | ICD-10-CM | POA: Diagnosis not present

## 2020-06-21 DIAGNOSIS — J329 Chronic sinusitis, unspecified: Secondary | ICD-10-CM | POA: Diagnosis not present

## 2020-06-21 DIAGNOSIS — K2100 Gastro-esophageal reflux disease with esophagitis, without bleeding: Secondary | ICD-10-CM | POA: Diagnosis not present

## 2020-06-21 DIAGNOSIS — H1013 Acute atopic conjunctivitis, bilateral: Secondary | ICD-10-CM | POA: Insufficient documentation

## 2020-06-21 DIAGNOSIS — J3089 Other allergic rhinitis: Secondary | ICD-10-CM

## 2020-06-21 DIAGNOSIS — B9789 Other viral agents as the cause of diseases classified elsewhere: Secondary | ICD-10-CM | POA: Diagnosis not present

## 2020-06-21 MED ORDER — DOXYCYCLINE HYCLATE 100 MG PO TABS: 100.0000 mg | ORAL_TABLET | Freq: Two times a day (BID) | ORAL | 0 refills | Status: DC

## 2020-06-21 MED ORDER — BENZONATATE 100 MG PO CAPS: 200.0000 mg | ORAL_CAPSULE | Freq: Three times a day (TID) | ORAL | 0 refills | Status: DC | PRN

## 2020-06-21 MED FILL — SYMBICORT 160-4.5 MCG INH: 160-4.5 | 30 days supply | Qty: 10 | Fill #4

## 2020-06-21 MED FILL — DOXYCYCLINE HYCLATE 100 MG: 100 | 10 days supply | Qty: 20 | Fill #0

## 2020-06-21 MED FILL — BENZONATATE 100 MG CAPS: 100 | 3 days supply | Qty: 20 | Fill #0

## 2020-06-21 NOTE — Patient Instructions (Addendum)
Asthma Continue montelukast 10 mg once a day to prevent cough or wheeze Continue Symbicort 160-2 puffs twice a day with a spacer to prevent cough or wheeze Continue albuterol 2 puffs every 4 hours as needed for cough or wheeze OR Instead use albuterol 0.083% solution via nebulizer one unit vial every 4 hours as needed for cough or wheeze  Allergic rhinitis Continue Allegra 180 mg once a day as needed for runny nose Continue Nasonex 1 to 2 sprays in each nostril once a day as needed for stuffy nose Consider saline nasal rinses as needed for nasal symptoms. Use this before any medicated nasal sprays for best result Continue azelastine 2 sprays in each nostril twice a day as needed for runny nose Continue Atrovent nasal spray 2 sprays in each nostril up to 3 times a day as needed for runny nose For thick postnasal drainage, begin Mucinex 600 mg to 1200 mg twice a day as needed Continue allergen avoidance measures directed toward grass pollen, weed pollen, mold, dust mite, cat, and cockroach  Viral sinusitis Begin prednisone 10 mg tablets. Take 2 tablets twice a day for 3 days, then take 2 tablets once a day for 1 day, then take 1 tablet on the 5th day, then stop Continue the treatment plan for allergic rhinitis If your symptoms are not improving or worsening over the next 2 days, begin Doxycycline 100 mg twice a day for 10 days Begin Tessalon Perles up to 3 times a day as needed for cough Use a cough drops containing benzocaine such as Cepacol  Reflux Restart famotidine 20 mg twice a day and continue Protonix 40 mg once a day Continue dietary and lifestyle modifications as listed below  Call the clinic if this treatment plan is not working well for you  Follow up in 2 months or sooner if needed.   Lifestyle Changes for Controlling GERD When you have GERD, stomach acid feels as if it's backing up toward your mouth. Whether or not you take medication to control your GERD, your symptoms can  often be improved with lifestyle changes.   Raise Your Head  Reflux is more likely to strike when you're lying down flat, because stomach fluid can  flow backward more easily. Raising the head of your bed 4-6 inches can help. To do this:  Slide blocks or books under the legs at the head of your bed. Or, place a wedge under  the mattress. Many foam stores can make a suitable wedge for you. The wedge  should run from your waist to the top of your head.  Don't just prop your head on several pillows. This increases pressure on your  stomach. It can make GERD worse.  Watch Your Eating Habits Certain foods may increase the acid in your stomach or relax the lower esophageal sphincter, making GERD more likely. It's best to avoid the following:  Coffee, tea, and carbonated drinks (with and without caffeine)  Fatty, fried, or spicy food  Mint, chocolate, onions, and tomatoes  Any other foods that seem to irritate your stomach or cause you pain  Relieve the Pressure  Eat smaller meals, even if you have to eat more often.  Don't lie down right after you eat. Wait a few hours for your stomach to empty.  Avoid tight belts and tight-fitting clothes.  Lose excess weight.  Tobacco and Alcohol  Avoid smoking tobacco and drinking alcohol. They can make GERD symptoms worse.

## 2020-06-21 NOTE — Progress Notes (Signed)
[facility] [place] [address] Dept: [phone]  FOLLOW UP NOTE  Patient ID: Regina Rice, female    DOB: 1966/07/16  Age: 54 y.o. MRN: [id number] Date of Office Visit: 06/21/2020  Assessment  Chief Complaint: Asthma, Allergic Rhinitis  (1 week ago), and Gastroesophageal Reflux (possible )  HPI Wynne Rozak is a 54 year old female who presents to the clinic for an acute evaluation of cough that began on Saturday night.  She was last seen in this clinic on 05/09/2020 by Dr. Nelva Bush for evaluation of asthma, allergic rhinitis, allergic conjunctivitis, and reflux.  At today's visit she reports that on Saturday night she began to experience cough producing clear phlegm and postnasal drainage.  She reports the symptoms progressed quickly throughout the weekend and is currently experiencing frequent throat clearing, feeling like something is stuck in her throat, postnasal drainage, and possible reflux.  She reports this feeling intensifies when she is lying down.  She did stop taking omeprazole about 6 months or more ago and began taking famotidine which had been providing relief.  She reports asthma as moderately well controlled with shortness of breath with cough producing clear mucus, slight wheeze occurring in the daytime and the nighttime, cough producing phlegm, and slight chest tightness.  She continues montelukast 10 mg once a day Symbicort 160-2 puffs twice a day and has recently started using albuterol via nebulizer twice a a day.  For allergic rhinitis she continues Atrovent in the evening, azelastine 2 sprays twice a day, Allegra, and nasal saline rinses.  At today's visit she does report rhinorrhea ranging from clear to yellow and occasional facial pain in the maxillary area.  She reports that she had an negative rapid Covid test on Tuesday and she received her Covid vaccines in May and June 2021.  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  Her current medications are listed in the chart.  Drug Allergies:  Allergies  Allergen Reactions  . Septra [Sulfamethoxazole-Trimethoprim] Swelling  . Amoxicillin Rash    Physical Exam: BP 110/60   Pulse 82   Temp 98.2 F (36.8 C) (Temporal)   Resp 18   Ht '5\' 11"'  (1.803 m)   SpO2 98%   BMI 36.40 kg/m    Physical Exam Vitals reviewed.  Constitutional:      Appearance: Normal appearance.  HENT:     Head: Normocephalic and atraumatic.     Right Ear: Tympanic membrane normal.     Left Ear: Tympanic membrane normal.     Nose:     Comments: Bilateral nares erythematous with clear nasal drainage.  Pharynx slightly erythematous with no exudate.  No thrush noted on tongue.  Ears normal.  Eyes normal. Eyes:     Conjunctiva/sclera: Conjunctivae normal.  Cardiovascular:     Rate and Rhythm: Normal rate and regular rhythm.     Heart sounds: Normal heart sounds. No murmur heard.   Pulmonary:     Effort: Pulmonary effort is normal.     Breath sounds: Normal breath sounds.     Comments: Lungs clear to auscultation Musculoskeletal:        General: Normal range of motion.     Cervical back: Normal range of motion and neck supple.  Skin:    General: Skin is warm and dry.  Neurological:     Mental Status: She is alert and oriented  to person, place, and time.  Psychiatric:        Mood and Affect: Mood normal.        Behavior: Behavior normal.        Thought Content: Thought content normal.        Judgment: Judgment normal.     Diagnostics: FVC 3.85, FEV1 3.33.  Predicted FVC 4.83, predicted FEV1 3.45.  Spirometry indicates normal ventilatory function.  Assessment and Plan: 1. Moderate persistent asthma, unspecified whether complicated   2. Non-seasonal allergic rhinitis due to other allergic trigger   3. Allergic conjunctivitis of both eyes   4. Gastroesophageal reflux disease with esophagitis,  unspecified whether hemorrhage   5. Viral sinusitis     Meds ordered this encounter  Medications  . benzonatate (TESSALON PERLES) 100 MG capsule    Sig: Take 2 capsules (200 mg total) by mouth 3 (three) times daily as needed for cough.    Dispense:  20 capsule    Refill:  0  . doxycycline (VIBRA-TABS) 100 MG tablet    Sig: Take 1 tablet (100 mg total) by mouth 2 (two) times daily.    Dispense:  20 tablet    Refill:  0    Patient Instructions  Asthma Continue montelukast 10 mg once a day to prevent cough or wheeze Continue Symbicort 160-2 puffs twice a day with a spacer to prevent cough or wheeze Continue albuterol 2 puffs every 4 hours as needed for cough or wheeze OR Instead use albuterol 0.083% solution via nebulizer one unit vial every 4 hours as needed for cough or wheeze  Allergic rhinitis Continue Allegra 180 mg once a day as needed for runny nose Continue Nasonex 1 to 2 sprays in each nostril once a day as needed for stuffy nose Consider saline nasal rinses as needed for nasal symptoms. Use this before any medicated nasal sprays for best result Continue azelastine 2 sprays in each nostril twice a day as needed for runny nose Continue Atrovent nasal spray 2 sprays in each nostril up to 3 times a day as needed for runny nose For thick postnasal drainage, begin Mucinex 600 mg to 1200 mg twice a day as needed Continue allergen avoidance measures directed toward grass pollen, weed pollen, mold, dust mite, cat, and cockroach  Viral sinusitis Begin prednisone 10 mg tablets. Take 2 tablets twice a day for 3 days, then take 2 tablets once a day for 1 day, then take 1 tablet on the 5th day, then stop Continue the treatment plan for allergic rhinitis If your symptoms are not improving or worsening over the next 2 days, begin Doxycycline 100 mg twice a day for 10 days Begin Tessalon Perles up to 3 times a day as needed for cough Use a cough drops containing benzocaine such as  Cepacol  Reflux Restart famotidine 20 mg twice a day and continue Protonix 40 mg once a day Continue dietary and lifestyle modifications as listed below  Call the clinic if this treatment plan is not working well for you  Follow up in 2 months or sooner if needed.   Return in about 2 months (around 08/21/2020), or if symptoms worsen or fail to improve.    Thank you for the opportunity to care for this patient.  Please do not hesitate to contact me with questions.  Gareth Morgan, FNP [facility]

## 2020-06-25 ENCOUNTER — Telehealth: Payer: Self-pay | Admitting: Family Medicine

## 2020-06-25 MED ORDER — IPRATROPIUM-ALBUTEROL 0.5-2.5 (3) MG/3ML IN SOLN: 3.0000 mL | RESPIRATORY_TRACT | 0 refills | Status: DC | PRN

## 2020-06-25 MED FILL — IPRAT-ALBUT 0.5-3(2.5) MG/3: 0.5-2.5 (3) | 5 days supply | Qty: 90 | Fill #0

## 2020-06-25 NOTE — Addendum Note (Signed)
Addended by: Garnet Sierras on: 06/25/2020 04:44 PM   Modules accepted: Orders

## 2020-06-25 NOTE — Telephone Encounter (Signed)
Called and spoke to patient and she has been advised of change. I did let patient know if she doesn't feel any better to either give [place] a callback or go to the ER/ Urgent Care.

## 2020-06-25 NOTE — Telephone Encounter (Signed)
Did the prednisone help at all?  What are her main complaints? Fevers or chills? Sick contacts?  Did she get COVID-19 testing? If not - get covid-19 testing.   Does she have duoneb to put into her nebulizer at home or does she only have albuterol?  Recommend the following:  1. Finish prednisone. 2. Finish antibiotics. 3. Use nebulizer at least twice a day before using her Symbicort. 4. Continue Symbicort 146mcg 2 puffs twice a day. 5.Continue montelukast 10mg  daily.

## 2020-06-25 NOTE — Telephone Encounter (Signed)
Patient called back and states she is doing all of the things that you recommended. She did state she is not using the breathing treatments before her symbicort and had took her last prednisone this morning. Patient states she did a rapid covid test yesterday and it was negative.

## 2020-06-25 NOTE — Telephone Encounter (Signed)
Left message for patient to call back  

## 2020-06-25 NOTE — Telephone Encounter (Signed)
Patient called and said that she was not any better and that anne gave her antibiotic. But her chest is still tight and her breathing is bad too. She is using all her meds. [facility]. [phone].

## 2020-06-25 NOTE — Telephone Encounter (Signed)
I'm sending in duoneb for the nebulizer to use at least twice a day - this is to be used like her albuterol in the nebulizer as it has albuterol and ipratropium.  Continue all other meds as discussed at last OV.

## 2020-06-25 NOTE — Telephone Encounter (Signed)
Dr. Maudie Mercury patient was seen last week by Webb Silversmith.

## 2020-06-26 DIAGNOSIS — Z20822 Contact with and (suspected) exposure to covid-19: Secondary | ICD-10-CM | POA: Diagnosis not present

## 2020-06-28 ENCOUNTER — Telehealth: Payer: Self-pay

## 2020-06-28 ENCOUNTER — Other Ambulatory Visit: Payer: Self-pay

## 2020-06-28 MED ORDER — MOXIFLOXACIN HCL 400 MG PO TABS: 400.0000 mg | ORAL_TABLET | Freq: Every day | ORAL | 0 refills | Status: AC

## 2020-06-28 MED FILL — MOXIFLOXACIN HCL 400 MG TAB: 400 | 7 days supply | Qty: 7 | Fill #0

## 2020-06-28 NOTE — Telephone Encounter (Signed)
[facility] has sent in the medication and spoke with patient per another encounter note.

## 2020-06-28 NOTE — Telephone Encounter (Signed)
I think we can handle everything by phone as visit last week and continued symptoms with no improvement.  Doxycycline is first line therapy for sinusitis treatment in those with penicillin allergy.  When this fails we can step up to Avelox 400mg  1 tab for 7 days.  She has had this antibiotic before.  Please let her know that this class of medications as a side effect can cause tendon issues including rupture however this is not very common but want to make sure she is aware of this.

## 2020-07-20 MED FILL — AZELASTINE HCL 137 MCG/SPRA: 137 | 25 days supply | Qty: 30 | Fill #4

## 2020-07-20 MED FILL — MOMETASONE FUROATE 50 MCG S: 50 | 30 days supply | Qty: 17 | Fill #4

## 2020-07-21 MED FILL — MONTELUKAST SOD 10 MG TAB: 10 | 90 days supply | Qty: 90 | Fill #1

## 2020-07-21 MED FILL — LISINOPRIL 10 MG TABS: 10 | 90 days supply | Qty: 90 | Fill #0

## 2020-07-21 MED FILL — SYMBICORT 160-4.5 MCG INH: 160-4.5 | 30 days supply | Qty: 10 | Fill #5

## 2020-08-08 ENCOUNTER — Ambulatory Visit: Payer: 59 | Admitting: Allergy

## 2020-08-08 ENCOUNTER — Other Ambulatory Visit: Payer: Self-pay

## 2020-08-08 ENCOUNTER — Encounter: Payer: Self-pay | Admitting: Allergy

## 2020-08-08 VITALS — BP 126/86 | HR 68 | Resp 14

## 2020-08-08 DIAGNOSIS — H1013 Acute atopic conjunctivitis, bilateral: Secondary | ICD-10-CM

## 2020-08-08 DIAGNOSIS — J4540 Moderate persistent asthma, uncomplicated: Secondary | ICD-10-CM | POA: Diagnosis not present

## 2020-08-08 DIAGNOSIS — K2100 Gastro-esophageal reflux disease with esophagitis, without bleeding: Secondary | ICD-10-CM

## 2020-08-08 DIAGNOSIS — J3089 Other allergic rhinitis: Secondary | ICD-10-CM

## 2020-08-08 NOTE — Patient Instructions (Addendum)
Asthma Continue montelukast 10 mg once a day to prevent cough or wheeze Continue Symbicort 160-2 puffs twice a day with a spacer to prevent cough or wheeze Continue albuterol 2 puffs every 4 hours as needed for cough or wheeze OR use albuterol 0.083% solution via nebulizer one unit vial every 4 hours as needed for cough or wheeze.    If needed for as needed use can use Duoneb (albuterol + ipratropium) in nebulizer every 6 hours as needed if not getting relief with albuterol use alone  Control goals:   Full participation in all desired activities (may need albuterol before activity)  Albuterol use two time or less a week on average (not counting use with activity)  Cough interfering with sleep two time or less a month  Oral steroids no more than once a year  No hospitalizations  Allergic rhinitis Continue Allegra 180 mg daily as this time Continue Nasonex 1 to 2 sprays in each nostril once a day as needed for stuffy nose Consider saline nasal rinses as needed for nasal symptoms. Use this before any medicated nasal sprays for best result Continue azelastine 2 sprays in each nostril twice a day as needed for runny nose Continue Atrovent nasal spray 2 sprays in each nostril up to 3 times a day as needed for runny nose Continue allergen avoidance measures directed toward grass pollen, weed pollen, mold, dust mite, cat, and cockroach  Reflux Continue famotidine 20 mg twice a day and continue Protonix 40 mg once a day Continue dietary and lifestyle modifications   Follow up in spring 2022 or sooner if needed

## 2020-08-08 NOTE — Progress Notes (Signed)
Follow-up Note  RE: Regina Rice MRN: [id number] DOB: 19-Dec-1965 Date of Office Visit: 08/08/2020   History of present illness: Regina Rice is a 54 y.o. female presenting today for follow-up of asthma, allergic rhinitis and reflux.  She was last seen in the office on 06/21/2020 by our nurse practitioner Ambs.  At that visit she had symptoms consistent with a viral sinusitis which continued to worsen over days to weeks and due to this she was prescribed doxycycline course for at that time presumed sinus infection.  Symptoms did not really improve with this and continued to worsen that she was treated with Avelox course which did improve her symptoms.  She has had a good response to sinus infection treated previously with Avelox.  She states she also was using Mucinex DM which helped with her cough and mucus production. She states that she has recovered from this illness she has been doing well.  She has not needed to use her albuterol much at all since recovery.  She continues on her Symbicort 2 puffs twice a day and daily Singulair.  She also will use nasal Atrovent in the mornings to help with the nasal drainage.  She also has access to azelastine and Nasonex for maintenance of drainage and congestion.  She has been taking Allegra daily for a couple years now.  She states now that she has recovered from her illness she has not had any significant allergy symptoms.  She also is taking famotidine for reflux control which has been helpful.  Review of systems: Review of Systems  Constitutional: Negative.   HENT:       See HPI  Eyes: Negative.   Respiratory: Negative.   Cardiovascular: Negative.   Gastrointestinal: Negative.   Musculoskeletal: Negative.   Skin: Negative.   Neurological: Negative.     All other systems negative unless noted above in HPI  Past medical/social/surgical/family history have been reviewed and are unchanged unless specifically indicated below.  No  changes  Medication List: Current Outpatient Medications  Medication Sig Dispense Refill  . albuterol (PROVENTIL) (2.5 MG/3ML) 0.083% nebulizer solution Take 3 mLs (2.5 mg total) by nebulization every 4 (four) hours as needed for wheezing or shortness of breath. 150 mL 1  . Albuterol Sulfate (PROAIR RESPICLICK) 127 (90 Base) MCG/ACT AEPB Take 1 each by mouth daily as needed. 1 each 1  . aspirin 81 MG tablet Take 81 mg by mouth daily.    Marland Kitchen azelastine (ASTELIN) 0.1 % nasal spray USE 2 SPRAYS IN EACH NOSTRIL TWICE A DAY AS DIRECTED 30 mL 4  . famotidine (PEPCID) 20 MG tablet Take 1 tablet (20 mg total) by mouth 2 (two) times daily. 60 tablet 5  . fexofenadine (ALLEGRA) 180 MG tablet Take 180 mg by mouth daily.    Marland Kitchen glucosamine-chondroitin 500-400 MG tablet Take 1 tablet by mouth 2 (two) times daily.    Marland Kitchen ipratropium (ATROVENT) 0.06 % nasal spray Place 2 sprays into both nostrils 3 (three) times daily. 15 mL 5  . ipratropium-albuterol (DUONEB) 0.5-2.5 (3) MG/3ML SOLN Take 3 mLs by nebulization every 4 (four) hours as needed (shorntess of breath, wheezing and chest tightness). 90 mL 0  . lisinopril (ZESTRIL) 10 MG tablet     . mometasone (NASONEX) 50 MCG/ACT nasal spray INHALE 2 SPRAYS INTO EACH NOSTRIL DAILY 17 g 5  . montelukast (SINGULAIR) 10 MG tablet TAKE 1 TABLET BY MOUTH AT BEDTIME 90 tablet 1  . Multiple Vitamin (MULTI-VITAMIN PO)  Take by mouth.    Marland Kitchen omeprazole (PRILOSEC) 20 MG capsule Take 20 mg by mouth daily as needed.    . rosuvastatin (CRESTOR) 10 MG tablet Take by mouth.    . SYMBICORT 160-4.5 MCG/ACT inhaler INHALE 2 PUFF INTO THE LUNGS TWO TIMES DAILY 10.2 g 5  . TRI-PREVIFEM 0.18/0.215/0.25 MG-35 MCG tablet   2  . TURMERIC PO Take by mouth daily.     No current facility-administered medications for this visit.     Known medication allergies: Allergies  Allergen Reactions  . Septra [Sulfamethoxazole-Trimethoprim] Swelling  . Amoxicillin Rash     Physical  examination: Blood pressure 126/86, pulse 68, resp. rate 14, SpO2 96 %.  General: Alert, interactive, in no acute distress. HEENT: PERRLA, TMs pearly gray, turbinates minimally edematous without discharge, post-pharynx non erythematous. Neck: Supple without lymphadenopathy. Lungs: Clear to auscultation without wheezing, rhonchi or rales. {no increased work of breathing. CV: Normal S1, S2 without murmurs. Abdomen: Nondistended, nontender. Skin: Warm and dry, without lesions or rashes. Extremities:  No clubbing, cyanosis or edema. Neuro:   Grossly intact.  Diagnositics/Labs: None today  Assessment and plan:   Asthma, mod persistent Continue montelukast 10 mg once a day to prevent cough or wheeze Continue Symbicort 160-2 puffs twice a day with a spacer to prevent cough or wheeze Continue albuterol 2 puffs every 4 hours as needed for cough or wheeze OR use albuterol 0.083% solution via nebulizer one unit vial every 4 hours as needed for cough or wheeze.    If needed for as needed use can use Duoneb (albuterol + ipratropium) in nebulizer every 6 hours as needed if not getting relief with albuterol use alone  Control goals:   Full participation in all desired activities (may need albuterol before activity)  Albuterol use two time or less a week on average (not counting use with activity)  Cough interfering with sleep two time or less a month  Oral steroids no more than once a year  No hospitalizations  Allergic rhinitis/conjunctivitis Continue Allegra 180 mg daily as this time Continue Nasonex 1 to 2 sprays in each nostril once a day as needed for stuffy nose Consider saline nasal rinses as needed for nasal symptoms. Use this before any medicated nasal sprays for best result Continue azelastine 2 sprays in each nostril twice a day as needed for runny nose Continue Atrovent nasal spray 2 sprays in each nostril up to 3 times a day as needed for runny nose Continue allergen avoidance  measures directed toward grass pollen, weed pollen, mold, dust mite, cat, and cockroach  Reflux Continue famotidine 20 mg twice a day and continue Protonix 40 mg once a day Continue dietary and lifestyle modifications   Follow up in spring 2022 or sooner if needed  I appreciate the opportunity to take part in Brailee's care. Please do not hesitate to contact me with questions.  Sincerely,   Prudy Feeler, MD Allergy/Immunology [facility] [place]

## 2020-08-22 ENCOUNTER — Other Ambulatory Visit: Payer: Self-pay | Admitting: Allergy and Immunology

## 2020-08-22 ENCOUNTER — Other Ambulatory Visit: Payer: Self-pay | Admitting: Allergy

## 2020-08-22 MED FILL — AZELASTINE HCL 137 MCG/SPRA: 137 | 25 days supply | Qty: 30 | Fill #0

## 2020-08-22 MED FILL — ROSUVASTATIN CALCIUM 10 MG: 10 | 90 days supply | Qty: 90 | Fill #2

## 2020-08-22 MED FILL — IPRATROPIUM 0.06% SPRAY: 0.06 | 13 days supply | Qty: 15 | Fill #5

## 2020-08-22 MED FILL — MOMETASONE FUROATE 50 MCG S: 50 | 30 days supply | Qty: 17 | Fill #5

## 2020-08-22 MED FILL — NORGESTIM-ETH ESTRAD TRIPHA: 0.18/0.215/ | 84 days supply | Qty: 84 | Fill #1

## 2020-09-11 ENCOUNTER — Other Ambulatory Visit: Payer: Self-pay | Admitting: Allergy

## 2020-09-11 MED FILL — SYMBICORT 160-4.5 MCG INH: 160-4.5 | 30 days supply | Qty: 10 | Fill #0

## 2020-09-24 ENCOUNTER — Other Ambulatory Visit: Payer: Self-pay | Admitting: Allergy

## 2020-09-24 MED FILL — AZELASTINE HCL 137 MCG/SPRA: 137 | 25 days supply | Qty: 30 | Fill #1

## 2020-09-24 MED FILL — MOMETASONE FUROATE 50 MCG S: 50 | 30 days supply | Qty: 17 | Fill #0

## 2020-10-17 MED FILL — SYMBICORT 160-4.5 MCG INH: 160-4.5 | 30 days supply | Qty: 10 | Fill #1

## 2020-10-22 ENCOUNTER — Other Ambulatory Visit: Payer: Self-pay | Admitting: Allergy

## 2020-10-22 ENCOUNTER — Other Ambulatory Visit (HOSPITAL_BASED_OUTPATIENT_CLINIC_OR_DEPARTMENT_OTHER): Payer: Self-pay | Admitting: Internal Medicine

## 2020-10-22 ENCOUNTER — Ambulatory Visit: Payer: 59 | Attending: Internal Medicine

## 2020-10-22 DIAGNOSIS — Z23 Encounter for immunization: Secondary | ICD-10-CM

## 2020-10-22 MED FILL — MOMETASONE FUROATE 50 MCG S: 50 | 30 days supply | Qty: 17 | Fill #1

## 2020-10-22 MED FILL — AZELASTINE HCL 137 MCG/SPRA: 137 | 25 days supply | Qty: 30 | Fill #2

## 2020-10-22 MED FILL — PFIZER-BIONTECH COVID-19 VA: 30 | 21 days supply | Qty: 0 | Fill #0

## 2020-10-22 MED FILL — LISINOPRIL 10 MG TABS: 10 | 90 days supply | Qty: 90 | Fill #1

## 2020-10-22 NOTE — Progress Notes (Signed)
   Covid-19 Vaccination Clinic  Name:  Regina Rice    MRN: [id number] DOB: 04-21-1966  10/22/2020  Regina Rice was observed post Covid-19 immunization for 15 minutes without incident. She was provided with Vaccine Information Sheet and instruction to access the V-Safe system.   Regina Rice was instructed to call 911 with any severe reactions post vaccine: Marland Kitchen Difficulty breathing  . Swelling of face and throat  . A fast heartbeat  . A bad rash all over body  . Dizziness and weakness   Immunizations Administered    Name Date Dose VIS Date Route   Pfizer COVID-19 Vaccine 10/22/2020 10:56 AM 0.3 mL 08/15/2020 Intramuscular   Manufacturer: [employer], [employer]   Lot: [id number]   NDC: [id number]

## 2020-10-23 ENCOUNTER — Other Ambulatory Visit: Payer: Self-pay | Admitting: Allergy

## 2020-10-23 MED FILL — IPRATROPIUM 0.06% SPRAY: 0.06 | 13 days supply | Qty: 15 | Fill #0

## 2020-10-23 MED FILL — MONTELUKAST SOD 10 MG TAB: 10 | 90 days supply | Qty: 90 | Fill #0

## 2020-10-25 ENCOUNTER — Other Ambulatory Visit: Payer: Self-pay

## 2020-10-25 ENCOUNTER — Other Ambulatory Visit: Payer: Self-pay | Admitting: Allergy

## 2020-10-25 MED ORDER — IPRATROPIUM BROMIDE 0.06 % NA SOLN: NASAL | 1 refills | Status: DC

## 2020-10-25 MED ORDER — MONTELUKAST SODIUM 10 MG PO TABS: 10.0000 mg | ORAL_TABLET | Freq: Every day | ORAL | 0 refills | Status: DC

## 2020-11-14 MED FILL — NORGESTIM-ETH ESTRAD TRIPHA: 0.18/0.215/ | 84 days supply | Qty: 84 | Fill #2

## 2020-11-23 MED FILL — AZELASTINE HCL 137 MCG/SPRA: 137 | 25 days supply | Qty: 30 | Fill #3

## 2020-11-23 MED FILL — SYMBICORT 160-4.5 MCG INH: 160-4.5 | 30 days supply | Qty: 10 | Fill #2

## 2020-11-23 MED FILL — IPRATROPIUM 0.06% SPRAY: 0.06 | 13 days supply | Qty: 15 | Fill #1

## 2020-11-23 MED FILL — MOMETASONE FUROATE 50 MCG S: 50 | 30 days supply | Qty: 17 | Fill #2

## 2020-11-23 MED FILL — ROSUVASTATIN CALCIUM 10 MG: 10 | 90 days supply | Qty: 90 | Fill #3

## 2020-11-23 NOTE — Telephone Encounter (Signed)
I spoke to patient and informed her of the medication change and sent in the medication for pick up.

## 2020-12-21 MED FILL — MOMETASONE FUROATE 50 MCG S: 50 | 30 days supply | Qty: 17 | Fill #3

## 2020-12-21 MED FILL — SYMBICORT 160-4.5 MCG INH: 160-4.5 | 30 days supply | Qty: 10 | Fill #3

## 2020-12-21 MED FILL — AZELASTINE HCL 137 MCG/SPRA: 137 | 25 days supply | Qty: 30 | Fill #4

## 2021-01-16 ENCOUNTER — Other Ambulatory Visit (HOSPITAL_BASED_OUTPATIENT_CLINIC_OR_DEPARTMENT_OTHER): Payer: Self-pay

## 2021-01-16 MED FILL — AZELASTINE HCL 137 MCG/SPRA: 137 | 25 days supply | Qty: 30 | Fill #5

## 2021-01-16 MED FILL — IPRATROPIUM 0.06% SPRAY: 0.06 | 13 days supply | Qty: 15 | Fill #2

## 2021-01-16 MED FILL — LISINOPRIL 10 MG TABS: 10 | 90 days supply | Qty: 90 | Fill #2

## 2021-01-17 ENCOUNTER — Other Ambulatory Visit: Payer: Self-pay

## 2021-01-17 MED FILL — SYMBICORT 160-4.5 MCG INH: 160-4.5 | 30 days supply | Qty: 10 | Fill #4

## 2021-01-17 MED FILL — MONTELUKAST SOD 10 MG TAB: 10 | 90 days supply | Qty: 90 | Fill #0

## 2021-01-17 MED FILL — MOMETASONE FUROATE 50 MCG S: 50 | 30 days supply | Qty: 17 | Fill #4

## 2021-01-29 ENCOUNTER — Other Ambulatory Visit (HOSPITAL_COMMUNITY): Payer: Self-pay

## 2021-01-30 ENCOUNTER — Other Ambulatory Visit (HOSPITAL_COMMUNITY): Payer: Self-pay

## 2021-01-30 ENCOUNTER — Telehealth: Payer: Self-pay | Admitting: Allergy

## 2021-01-30 ENCOUNTER — Other Ambulatory Visit: Payer: Self-pay

## 2021-01-30 MED ORDER — LEVOCETIRIZINE DIHYDROCHLORIDE 5 MG PO TABS
5.0000 mg | ORAL_TABLET | Freq: Every evening | ORAL | 5 refills | Status: DC
  Filled 2021-01-30: qty 30, 30d supply, fill #0

## 2021-01-30 NOTE — Telephone Encounter (Signed)
Pt states she has been using allegra for some time now, and would like to switch medication for her allergies. She would like to know which medication Dr. Nelva Bush would recommend for her to take.   Please contact pt @ this number: [phone]   Please advise.

## 2021-01-30 NOTE — Telephone Encounter (Signed)
Would recommend a trial of Xyzal 5mg  daily

## 2021-01-30 NOTE — Telephone Encounter (Signed)
Please advise to change in antihistamine

## 2021-01-30 NOTE — Telephone Encounter (Signed)
Lm for pt to call us back about this and also went on an sent in xyzal to [facility] long outpatient pharmacy

## 2021-01-31 ENCOUNTER — Other Ambulatory Visit (HOSPITAL_COMMUNITY): Payer: Self-pay

## 2021-01-31 ENCOUNTER — Ambulatory Visit: Payer: 59 | Admitting: Family Medicine

## 2021-01-31 ENCOUNTER — Encounter: Payer: Self-pay | Admitting: Family Medicine

## 2021-01-31 ENCOUNTER — Other Ambulatory Visit: Payer: Self-pay

## 2021-01-31 VITALS — BP 122/80 | HR 75 | Temp 98.2°F | Resp 20

## 2021-01-31 DIAGNOSIS — K2100 Gastro-esophageal reflux disease with esophagitis, without bleeding: Secondary | ICD-10-CM | POA: Diagnosis not present

## 2021-01-31 DIAGNOSIS — J4541 Moderate persistent asthma with (acute) exacerbation: Secondary | ICD-10-CM | POA: Diagnosis not present

## 2021-01-31 DIAGNOSIS — J3089 Other allergic rhinitis: Secondary | ICD-10-CM

## 2021-01-31 DIAGNOSIS — J0100 Acute maxillary sinusitis, unspecified: Secondary | ICD-10-CM | POA: Diagnosis not present

## 2021-01-31 DIAGNOSIS — J302 Other seasonal allergic rhinitis: Secondary | ICD-10-CM | POA: Diagnosis not present

## 2021-01-31 MED ORDER — MOXIFLOXACIN HCL 400 MG PO TABS
ORAL_TABLET | ORAL | 0 refills | Status: DC
  Filled 2021-01-31: qty 10, 10d supply, fill #0

## 2021-01-31 MED ORDER — LEVOCETIRIZINE DIHYDROCHLORIDE 5 MG PO TABS: 5.0000 mg | ORAL_TABLET | Freq: Every evening | ORAL | 5 refills | Status: DC

## 2021-01-31 NOTE — Patient Instructions (Addendum)
Acute sinusitis Begin moxifloxacin 400 one tablet once a day for 10 days Prednisone 10 mg tablets. Take 2 tablets once a day for 4 days, then take 1 tablet on the 5th day, then stop.  Consider evaluation by ENT specialist for frequent sinus infections  Asthma Continue montelukast 10 mg once a day to prevent cough or wheeze Continue Symbicort 160-2 puffs twice a day with a spacer to prevent cough or wheeze Continue albuterol 2 puffs every 4 hours as needed for cough or wheeze OR Instead use albuterol 0.083% solution via nebulizer one unit vial every 4 hours as needed for cough or wheeze  Allergic rhinitis Start Xyzal 5 mg once a day as needed for runny nose or itch. Remember to rotate to a different antihistamine about every 3 months. Some examples of over the counter antihistamines include Zyrtec (cetirizine), Xyzal (levocetirizine), Allegra (fexofenadine), and Claritin (loratidine).  Continue Nasonex 1 to 2 sprays in each nostril once a day as needed for stuffy nose.  In the right nostril, point the applicator out toward the right ear. In the left nostril, point the applicator out toward the left ear Consider saline nasal rinses as needed for nasal symptoms. Use this before any medicated nasal sprays for best result Continue azelastine 2 sprays in each nostril twice a day as needed for runny nose Continue Atrovent nasal spray 2 sprays in each nostril up to 3 times a day as needed for runny nose For thick postnasal drainage, begin Mucinex 600 mg to 1200 mg twice a day as needed Continue allergen avoidance measures directed toward grass pollen, weed pollen, mold, dust mite, cat, and cockroach If your symptoms are not controlled by medications, consider a course of allergen immunotherapy. We will need to update environmental skin testing before beginning allergen immunotherapy. Remember to stop antihistamines for 3 days before the skin testing appointment  Cough Begin Tessalon Perles up to 3 times  a day as needed for cough Use a cough drops containing benzocaine such as Cepacol  Reflux Continue famotidine 20 mg twice a day to control reflux Continue dietary and lifestyle modifications as listed below  Call the clinic if this treatment plan is not working well for you  Follow up in 2 months or sooner if needed.   Lifestyle Changes for Controlling GERD When you have GERD, stomach acid feels as if it's backing up toward your mouth. Whether or not you take medication to control your GERD, your symptoms can often be improved with lifestyle changes.   Raise Your Head  Reflux is more likely to strike when you're lying down flat, because stomach fluid can  flow backward more easily. Raising the head of your bed 4-6 inches can help. To do this:  Slide blocks or books under the legs at the head of your bed. Or, place a wedge under  the mattress. Many foam stores can make a suitable wedge for you. The wedge  should run from your waist to the top of your head.  Don't just prop your head on several pillows. This increases pressure on your  stomach. It can make GERD worse.  Watch Your Eating Habits Certain foods may increase the acid in your stomach or relax the lower esophageal sphincter, making GERD more likely. It's best to avoid the following:  Coffee, tea, and carbonated drinks (with and without caffeine)  Fatty, fried, or spicy food  Mint, chocolate, onions, and tomatoes  Any other foods that seem to irritate your stomach or cause  you pain  Relieve the Pressure  Eat smaller meals, even if you have to eat more often.  Don't lie down right after you eat. Wait a few hours for your stomach to empty.  Avoid tight belts and tight-fitting clothes.  Lose excess weight.  Tobacco and Alcohol  Avoid smoking tobacco and drinking alcohol. They can make GERD symptoms worse.

## 2021-01-31 NOTE — Progress Notes (Signed)
[place] [address] Dept: [phone]  FOLLOW UP NOTE  Patient ID: Regina Rice, female    DOB: Dec 27, 1965  Age: 55 y.o. MRN: [id number] Date of Office Visit: 01/31/2021  Assessment  Chief Complaint: Sinus Problem (Dry/scratchy throat, sinus pressure, ears feel full, top of mouth hurts, PND going to chest making chest feel heavy.)  HPI Regina Rice is a 55 year old female who presents to the clinic for evaluation of acute sinusitis.  She was last seen in this clinic on 08/08/2020 by Dr. Nelva Bush for evaluation of asthma, allergic rhinitis, allergic conjunctivitis, and reflux.  At today's visit, she reports that she has been experiencing symptoms including ear fullness, throbbing underneath both eyes and across forehead, thick postnasal drainage, scratchy throat, and green nasal drainage that began about 1-1/2 weeks ago.  She reports taking Allegra 180 mg once a day, using Nasacort daily, and using azelastine twice a day.  She is not currently using a nasal saline rinse.  She last had an antibiotic (moxifloxacin) for treatment of acute sinusitis on 06/28/2020.  Her last skin testing was in 2013 and was positive to grass pollen, weed pollen, mold, dust mite, cat, and cockroach.  Asthma is reported as moderately well controlled with chest tightness, shortness of breath with vigorous activity, occasional wheeze with activity, and infrequent cough producing thick yellowish mucus.  She continues montelukast 10 mg once a day, Symbicort 160-2 puffs twice a day and has not used her albuterol since her last visit to this clinic.  Reflux is reported as moderately well controlled with heartburn occurring for about 1-1/2 weeks.  She continues famotidine twice a day.  She reports that she has seen ear nose and throat specialist previously with a CT sinus scan in 2013 indicating " chronic sinusitis affecting predominantly the bilateral maxillary, right ethmoid, and right frontal  sinuses".  She has an allergy to amoxicillin and had failed treatment with doxycycline for acute sinusitis in the past. Avelox has resolved her symptoms of sinusitis on previous occasions. Adverse effects of Avelox were thoroughly discussed at today's visit.  We also discussed options for more preventative therapy such as obtaining environmental allergy skin testing, allergy injections, and evaluation by an ENT specialist.  Her current medications are listed in the chart.   Drug Allergies:  Allergies  Allergen Reactions  . Septra [Sulfamethoxazole-Trimethoprim] Swelling  . Amoxicillin Rash    Physical Exam: BP 122/80   Pulse 75   Temp 98.2 F (36.8 C) (Temporal)   Resp 20   SpO2 96%    Physical Exam Vitals reviewed.  Constitutional:      Appearance: Normal appearance.  HENT:     Head: Normocephalic and atraumatic.     Right Ear: Tympanic membrane normal.     Left Ear: Tympanic membrane normal.     Nose:     Comments: Bilateral nares erythematous with clear nasal drainage noted.  Pharynx erythematous with no exudate.  Ears normal.  Eyes normal. Eyes:     Conjunctiva/sclera: Conjunctivae normal.  Cardiovascular:     Rate and Rhythm: Normal rate and regular rhythm.     Heart sounds: Normal heart sounds. No murmur heard.   Pulmonary:     Effort: Pulmonary effort is normal.     Breath sounds: Normal breath sounds.     Comments: Lungs clear to auscultation Musculoskeletal:        General: Normal range of motion.     Cervical back: Normal range of motion  and neck supple.  Skin:    General: Skin is warm and dry.  Neurological:     Mental Status: She is alert and oriented to person, place, and time.  Psychiatric:        Mood and Affect: Mood normal.        Behavior: Behavior normal.        Thought Content: Thought content normal.        Judgment: Judgment normal.     Diagnostics: FVC 4.42, FEV1 3.73.  Predicted FVC 4.38, predicted FEV1 3.45.  Spirometry indicates normal  ventilatory function.  Assessment and Plan: 1. Moderate persistent asthma with acute exacerbation   2. Seasonal and perennial allergic rhinitis   3. Acute maxillary sinusitis, recurrence not specified   4. Gastroesophageal reflux disease with esophagitis, unspecified whether hemorrhage     Meds ordered this encounter  Medications  . moxifloxacin (AVELOX) 400 MG tablet    Sig: One tablet daily for 10 days    Dispense:  10 tablet    Refill:  0  . levocetirizine (XYZAL) 5 MG tablet    Sig: Take 1 tablet (5 mg total) by mouth every evening.    Dispense:  30 tablet    Refill:  5    Patient Instructions  Acute sinusitis Begin moxifloxacin 400 one tablet once a day for 10 days Prednisone 10 mg tablets. Take 2 tablets once a day for 4 days, then take 1 tablet on the 5th day, then stop.  Consider evaluation by ENT specialist for frequent sinus infections  Asthma Continue montelukast 10 mg once a day to prevent cough or wheeze Continue Symbicort 160-2 puffs twice a day with a spacer to prevent cough or wheeze Continue albuterol 2 puffs every 4 hours as needed for cough or wheeze OR Instead use albuterol 0.083% solution via nebulizer one unit vial every 4 hours as needed for cough or wheeze  Allergic rhinitis Start Xyzal 5 mg once a day as needed for runny nose or itch. Remember to rotate to a different antihistamine about every 3 months. Some examples of over the counter antihistamines include Zyrtec (cetirizine), Xyzal (levocetirizine), Allegra (fexofenadine), and Claritin (loratidine).  Continue Nasonex 1 to 2 sprays in each nostril once a day as needed for stuffy nose.  In the right nostril, point the applicator out toward the right ear. In the left nostril, point the applicator out toward the left ear Consider saline nasal rinses as needed for nasal symptoms. Use this before any medicated nasal sprays for best result Continue azelastine 2 sprays in each nostril twice a day as needed for  runny nose Continue Atrovent nasal spray 2 sprays in each nostril up to 3 times a day as needed for runny nose For thick postnasal drainage, begin Mucinex 600 mg to 1200 mg twice a day as needed Continue allergen avoidance measures directed toward grass pollen, weed pollen, mold, dust mite, cat, and cockroach If your symptoms are not controlled by medications, consider a course of allergen immunotherapy.  Cough Begin Tessalon Perles up to 3 times a day as needed for cough Use a cough drops containing benzocaine such as Cepacol  Reflux Continue famotidine 20 mg twice a day to control reflux Continue dietary and lifestyle modifications as listed below  Call the clinic if this treatment plan is not working well for you  Follow up in 2 months or sooner if needed.   Return in about 2 months (around 04/02/2021), or if symptoms worsen or fail  to improve.    Thank you for the opportunity to care for this patient.  Please do not hesitate to contact me with questions.  Gareth Morgan, FNP [facility] [place]

## 2021-02-01 ENCOUNTER — Other Ambulatory Visit (HOSPITAL_COMMUNITY): Payer: Self-pay

## 2021-02-04 ENCOUNTER — Other Ambulatory Visit (HOSPITAL_COMMUNITY): Payer: Self-pay

## 2021-02-04 MED FILL — Norgestimate-Eth Estrad Tab 0.18-35/0.215-35/0.25-35 MG-MCG: ORAL | 84 days supply | Qty: 84 | Fill #0 | Status: AC

## 2021-02-07 ENCOUNTER — Ambulatory Visit: Payer: 59 | Admitting: Allergy

## 2021-02-07 ENCOUNTER — Other Ambulatory Visit (HOSPITAL_COMMUNITY): Payer: Self-pay

## 2021-02-07 ENCOUNTER — Telehealth: Payer: Self-pay | Admitting: Allergy

## 2021-02-07 DIAGNOSIS — J4530 Mild persistent asthma, uncomplicated: Secondary | ICD-10-CM

## 2021-02-07 MED ORDER — PROAIR RESPICLICK 108 (90 BASE) MCG/ACT IN AEPB
1.0000 | INHALATION_SPRAY | Freq: Every day | RESPIRATORY_TRACT | 3 refills | Status: DC | PRN
  Filled 2021-02-07: qty 1, 90d supply, fill #0

## 2021-02-07 NOTE — Telephone Encounter (Signed)
Called and spoke to patient to inform her that the albuterol has been sent into the [facility]. Patient verbalized understanding and had no other concerns or questions at the tine of the call.

## 2021-02-07 NOTE — Telephone Encounter (Signed)
Patient needs a new prescription for albuterol Proair respiclick sent to [facility] outpatient pharmacy. Pharmacy states it has been too long and she needs a new script.  Please advise.

## 2021-02-08 ENCOUNTER — Other Ambulatory Visit: Payer: Self-pay | Admitting: Allergy

## 2021-02-08 ENCOUNTER — Other Ambulatory Visit (HOSPITAL_COMMUNITY): Payer: Self-pay

## 2021-02-08 DIAGNOSIS — J4530 Mild persistent asthma, uncomplicated: Secondary | ICD-10-CM

## 2021-02-11 ENCOUNTER — Other Ambulatory Visit (HOSPITAL_COMMUNITY): Payer: Self-pay

## 2021-02-11 MED ORDER — PROAIR RESPICLICK 108 (90 BASE) MCG/ACT IN AEPB
2.0000 | INHALATION_SPRAY | Freq: Four times a day (QID) | RESPIRATORY_TRACT | 2 refills | Status: DC | PRN
  Filled 2021-02-11: qty 1, 25d supply, fill #0

## 2021-02-12 ENCOUNTER — Other Ambulatory Visit: Payer: Self-pay

## 2021-02-12 ENCOUNTER — Other Ambulatory Visit (HOSPITAL_COMMUNITY): Payer: Self-pay

## 2021-02-12 MED ORDER — ALBUTEROL SULFATE HFA 108 (90 BASE) MCG/ACT IN AERS
2.0000 | INHALATION_SPRAY | RESPIRATORY_TRACT | 3 refills | Status: DC | PRN
Start: 2021-02-12 — End: 2021-02-12
  Filled 2021-02-12: qty 18, 25d supply, fill #0

## 2021-02-12 MED ORDER — ALBUTEROL SULFATE HFA 108 (90 BASE) MCG/ACT IN AERS
2.0000 | INHALATION_SPRAY | RESPIRATORY_TRACT | 3 refills | Status: DC | PRN
  Filled 2021-02-12: qty 18, 16d supply, fill #0
  Filled 2021-02-12: qty 18, 25d supply, fill #0

## 2021-02-12 NOTE — Telephone Encounter (Signed)
Proair hfa has been sent in to pts pharmacy

## 2021-02-12 NOTE — Telephone Encounter (Signed)
sent in refill to correct pharmacy Regina Rice was calling pt to let her know about the change in pharmacies

## 2021-02-12 NOTE — Telephone Encounter (Signed)
Proair respiclick is not covered by insurance and patient needs an alternative. Pharmacy sent a request over but states they have not heard anything from [place].  Uses [facility].  Please advise.

## 2021-02-12 NOTE — Telephone Encounter (Signed)
Prescription needs to be sent to [facility]. Please call patient when medication is switched.

## 2021-02-12 NOTE — Addendum Note (Signed)
Addended by: Felipa Emory on: 02/12/2021 02:02 PM   Modules accepted: Orders

## 2021-02-17 ENCOUNTER — Other Ambulatory Visit (HOSPITAL_COMMUNITY): Payer: Self-pay

## 2021-02-17 ENCOUNTER — Other Ambulatory Visit: Payer: Self-pay | Admitting: Allergy

## 2021-02-17 MED FILL — Budesonide-Formoterol Fumarate Dihyd Aerosol 160-4.5 MCG/ACT: RESPIRATORY_TRACT | 30 days supply | Qty: 10.2 | Fill #0 | Status: AC

## 2021-02-17 MED FILL — Mometasone Furoate Nasal Susp 50 MCG/ACT: NASAL | 30 days supply | Qty: 17 | Fill #0 | Status: AC

## 2021-02-18 ENCOUNTER — Other Ambulatory Visit (HOSPITAL_COMMUNITY): Payer: Self-pay

## 2021-02-18 ENCOUNTER — Other Ambulatory Visit: Payer: Self-pay | Admitting: Allergy

## 2021-02-18 MED ORDER — AZELASTINE HCL 137 MCG/SPRAY NA SOLN
NASAL | 5 refills | Status: DC
  Filled 2021-02-18: qty 30, 25d supply, fill #0
  Filled 2021-03-22: qty 30, 25d supply, fill #1
  Filled 2021-04-21: qty 30, 25d supply, fill #2

## 2021-02-18 MED ORDER — ROSUVASTATIN CALCIUM 10 MG PO TABS
10.0000 mg | ORAL_TABLET | Freq: Every day | ORAL | 0 refills | Status: DC
  Filled 2021-02-18: qty 90, 90d supply, fill #0

## 2021-02-21 ENCOUNTER — Other Ambulatory Visit (HOSPITAL_COMMUNITY): Payer: Self-pay

## 2021-02-25 ENCOUNTER — Other Ambulatory Visit (HOSPITAL_COMMUNITY): Payer: Self-pay

## 2021-03-08 ENCOUNTER — Other Ambulatory Visit (HOSPITAL_COMMUNITY): Payer: Self-pay

## 2021-03-08 MED FILL — Ipratropium Bromide Nasal Soln 0.06% (42 MCG/SPRAY): NASAL | 30 days supply | Qty: 15 | Fill #0 | Status: AC

## 2021-03-22 ENCOUNTER — Other Ambulatory Visit (HOSPITAL_COMMUNITY): Payer: Self-pay

## 2021-03-22 ENCOUNTER — Telehealth: Payer: Self-pay | Admitting: Allergy

## 2021-03-22 MED ORDER — TRIAMCINOLONE ACETONIDE 55 MCG/ACT NA AERO
2.0000 | INHALATION_SPRAY | Freq: Every day | NASAL | 4 refills | Status: DC
  Filled 2021-03-22 (×2): qty 1, 1d supply, fill #0

## 2021-03-22 NOTE — Telephone Encounter (Signed)
Will be other potentially cheaper options are going to be Flonase, Rhinocort or Nasacort.  Lets try for Nasacort 2 sprays each nostril daily for nasal congestion

## 2021-03-22 NOTE — Telephone Encounter (Signed)
Patient states she needs a refill on her Nasonex but her insurance contacted her and stated they will not cover that medication anymore. Patient would like to know if there is a cheaper, alternative medication that can be sent in instead.   Best Pharmacy:  [facility] [address], [place] [address]

## 2021-03-22 NOTE — Telephone Encounter (Signed)
Nasacort sent to [facility] x 1 with 4 refills. Looks like it's not covered either; she may have to buy over the counter.

## 2021-03-26 ENCOUNTER — Other Ambulatory Visit (HOSPITAL_COMMUNITY): Payer: Self-pay

## 2021-03-26 DIAGNOSIS — E7800 Pure hypercholesterolemia, unspecified: Secondary | ICD-10-CM | POA: Diagnosis not present

## 2021-03-26 DIAGNOSIS — R7303 Prediabetes: Secondary | ICD-10-CM | POA: Diagnosis not present

## 2021-03-26 DIAGNOSIS — Z0000 Encounter for general adult medical examination without abnormal findings: Secondary | ICD-10-CM | POA: Diagnosis not present

## 2021-03-26 DIAGNOSIS — Z309 Encounter for contraceptive management, unspecified: Secondary | ICD-10-CM | POA: Diagnosis not present

## 2021-03-26 DIAGNOSIS — Z23 Encounter for immunization: Secondary | ICD-10-CM | POA: Diagnosis not present

## 2021-03-26 DIAGNOSIS — I10 Essential (primary) hypertension: Secondary | ICD-10-CM | POA: Diagnosis not present

## 2021-03-26 DIAGNOSIS — J45909 Unspecified asthma, uncomplicated: Secondary | ICD-10-CM | POA: Diagnosis not present

## 2021-03-26 MED ORDER — LISINOPRIL 10 MG PO TABS
ORAL_TABLET | ORAL | 3 refills | Status: DC
  Filled 2021-03-26: qty 90, 90d supply, fill #0
  Filled 2021-07-20: qty 90, 90d supply, fill #1
  Filled 2021-10-29: qty 90, 90d supply, fill #2
  Filled 2022-01-22: qty 90, 90d supply, fill #3

## 2021-03-26 MED ORDER — ROSUVASTATIN CALCIUM 10 MG PO TABS
ORAL_TABLET | ORAL | 3 refills | Status: DC
  Filled 2021-03-26 – 2021-05-21 (×2): qty 90, 90d supply, fill #0
  Filled 2021-08-24: qty 90, 90d supply, fill #1
  Filled 2021-10-29: qty 90, 90d supply, fill #2
  Filled 2022-02-25: qty 90, 90d supply, fill #3

## 2021-04-02 ENCOUNTER — Other Ambulatory Visit (HOSPITAL_COMMUNITY): Payer: Self-pay

## 2021-04-02 DIAGNOSIS — Z23 Encounter for immunization: Secondary | ICD-10-CM | POA: Diagnosis not present

## 2021-04-05 ENCOUNTER — Other Ambulatory Visit (HOSPITAL_COMMUNITY): Payer: Self-pay

## 2021-04-05 ENCOUNTER — Other Ambulatory Visit: Payer: Self-pay | Admitting: Allergy

## 2021-04-05 MED ORDER — BUDESONIDE-FORMOTEROL FUMARATE 160-4.5 MCG/ACT IN AERO
2.0000 | INHALATION_SPRAY | Freq: Two times a day (BID) | RESPIRATORY_TRACT | 0 refills | Status: DC
  Filled 2021-04-05: qty 10.2, 30d supply, fill #0

## 2021-04-21 ENCOUNTER — Other Ambulatory Visit (HOSPITAL_COMMUNITY): Payer: Self-pay

## 2021-04-21 ENCOUNTER — Telehealth: Payer: Self-pay | Admitting: Allergy

## 2021-04-21 DIAGNOSIS — J4530 Mild persistent asthma, uncomplicated: Secondary | ICD-10-CM

## 2021-04-22 ENCOUNTER — Other Ambulatory Visit (HOSPITAL_COMMUNITY): Payer: Self-pay

## 2021-04-22 MED ORDER — PROAIR RESPICLICK 108 (90 BASE) MCG/ACT IN AEPB
2.0000 | INHALATION_SPRAY | Freq: Four times a day (QID) | RESPIRATORY_TRACT | 2 refills | Status: DC | PRN
  Filled 2021-04-22: qty 1, 25d supply, fill #0

## 2021-04-22 MED ORDER — TRIAMCINOLONE ACETONIDE 55 MCG/ACT NA AERO
2.0000 | INHALATION_SPRAY | Freq: Every day | NASAL | 4 refills | Status: DC
  Filled 2021-04-22: qty 1, 1d supply, fill #0

## 2021-04-22 MED ORDER — FAMOTIDINE 20 MG PO TABS
20.0000 mg | ORAL_TABLET | Freq: Two times a day (BID) | ORAL | 5 refills | Status: DC
  Filled 2021-04-22: qty 60, 30d supply, fill #0

## 2021-04-22 MED ORDER — BUDESONIDE-FORMOTEROL FUMARATE 160-4.5 MCG/ACT IN AERO
2.0000 | INHALATION_SPRAY | Freq: Two times a day (BID) | RESPIRATORY_TRACT | 0 refills | Status: DC
  Filled 2021-04-22 – 2021-05-09 (×4): qty 10.2, 30d supply, fill #0

## 2021-04-22 MED ORDER — MONTELUKAST SODIUM 10 MG PO TABS
10.0000 mg | ORAL_TABLET | Freq: Every day | ORAL | 0 refills | Status: DC
  Filled 2021-04-22 – 2021-04-25 (×2): qty 90, 90d supply, fill #0

## 2021-04-22 MED ORDER — LEVOCETIRIZINE DIHYDROCHLORIDE 5 MG PO TABS
5.0000 mg | ORAL_TABLET | Freq: Every evening | ORAL | 5 refills | Status: AC
  Filled 2021-04-22: qty 30, 30d supply, fill #0

## 2021-04-22 MED ORDER — ALBUTEROL SULFATE HFA 108 (90 BASE) MCG/ACT IN AERS
2.0000 | INHALATION_SPRAY | RESPIRATORY_TRACT | 3 refills | Status: DC | PRN
  Filled 2021-04-22: qty 18, 16d supply, fill #0

## 2021-04-22 MED ORDER — ALBUTEROL SULFATE (2.5 MG/3ML) 0.083% IN NEBU
2.5000 mg | INHALATION_SOLUTION | RESPIRATORY_TRACT | 1 refills | Status: DC | PRN
  Filled 2021-04-22: qty 150, 9d supply, fill #0

## 2021-04-22 MED ORDER — IPRATROPIUM BROMIDE 0.06 % NA SOLN
2.0000 | Freq: Three times a day (TID) | NASAL | 3 refills | Status: DC
  Filled 2021-04-22: qty 15, 25d supply, fill #0
  Filled 2021-07-20: qty 15, 14d supply, fill #0
  Filled 2021-09-29: qty 15, 14d supply, fill #1

## 2021-04-22 MED ORDER — IPRATROPIUM BROMIDE 0.06 % NA SOLN
2.0000 | Freq: Three times a day (TID) | NASAL | 1 refills | Status: DC
  Filled 2021-04-22: qty 45, 75d supply, fill #0
  Filled 2021-04-25: qty 45, 39d supply, fill #0
  Filled 2021-09-30: qty 45, 41d supply, fill #0

## 2021-04-22 MED ORDER — AZELASTINE HCL 137 MCG/SPRAY NA SOLN
2.0000 | Freq: Two times a day (BID) | NASAL | 5 refills | Status: DC | PRN
  Filled 2021-04-22 – 2021-05-21 (×2): qty 30, fill #0
  Filled 2021-05-23: qty 30, 25d supply, fill #0
  Filled 2021-06-20 (×2): qty 30, 25d supply, fill #1
  Filled 2021-07-23: qty 30, 25d supply, fill #2
  Filled 2021-08-24: qty 30, 25d supply, fill #3
  Filled 2021-09-29: qty 30, 25d supply, fill #4
  Filled 2021-09-30: qty 30, 25d supply, fill #0

## 2021-04-22 MED ORDER — MOMETASONE FUROATE 50 MCG/ACT NA SUSP
2.0000 | Freq: Every day | NASAL | 5 refills | Status: DC
  Filled 2021-04-22: qty 17, 30d supply, fill #0

## 2021-04-22 MED ORDER — IPRATROPIUM-ALBUTEROL 0.5-2.5 (3) MG/3ML IN SOLN
3.0000 mL | RESPIRATORY_TRACT | 0 refills | Status: AC | PRN
  Filled 2021-04-22: qty 90, 5d supply, fill #0

## 2021-04-22 NOTE — Telephone Encounter (Signed)
Patient called back and states she has a follow up scheduled with Dr Nelva Bush in October. She is wondering does she still need to come in sooner as stated in [place] last follow up visit to get her refills done?

## 2021-04-22 NOTE — Addendum Note (Signed)
Addended by: Clovis Cao A on: 04/22/2021 04:28 PM   Modules accepted: Orders

## 2021-04-23 ENCOUNTER — Other Ambulatory Visit (HOSPITAL_COMMUNITY): Payer: Self-pay

## 2021-04-24 ENCOUNTER — Other Ambulatory Visit (HOSPITAL_COMMUNITY): Payer: Self-pay

## 2021-04-25 ENCOUNTER — Other Ambulatory Visit (HOSPITAL_COMMUNITY): Payer: Self-pay

## 2021-04-26 ENCOUNTER — Other Ambulatory Visit (HOSPITAL_COMMUNITY): Payer: Self-pay

## 2021-04-26 MED ORDER — NORGESTIM-ETH ESTRAD TRIPHASIC 0.18/0.215/0.25 MG-35 MCG PO TABS
ORAL_TABLET | ORAL | 0 refills | Status: DC
  Filled 2021-04-26: qty 84, 84d supply, fill #0

## 2021-05-02 ENCOUNTER — Other Ambulatory Visit (HOSPITAL_COMMUNITY): Payer: Self-pay

## 2021-05-02 DIAGNOSIS — H40013 Open angle with borderline findings, low risk, bilateral: Secondary | ICD-10-CM | POA: Diagnosis not present

## 2021-05-02 DIAGNOSIS — H52203 Unspecified astigmatism, bilateral: Secondary | ICD-10-CM | POA: Diagnosis not present

## 2021-05-02 DIAGNOSIS — H524 Presbyopia: Secondary | ICD-10-CM | POA: Diagnosis not present

## 2021-05-02 DIAGNOSIS — H5213 Myopia, bilateral: Secondary | ICD-10-CM | POA: Diagnosis not present

## 2021-05-06 ENCOUNTER — Other Ambulatory Visit (HOSPITAL_COMMUNITY): Payer: Self-pay

## 2021-05-09 ENCOUNTER — Other Ambulatory Visit (HOSPITAL_COMMUNITY): Payer: Self-pay

## 2021-05-21 ENCOUNTER — Other Ambulatory Visit (HOSPITAL_COMMUNITY): Payer: Self-pay

## 2021-05-23 ENCOUNTER — Other Ambulatory Visit (HOSPITAL_COMMUNITY): Payer: Self-pay

## 2021-05-30 ENCOUNTER — Other Ambulatory Visit (HOSPITAL_COMMUNITY): Payer: Self-pay

## 2021-05-30 DIAGNOSIS — Z309 Encounter for contraceptive management, unspecified: Secondary | ICD-10-CM | POA: Diagnosis not present

## 2021-05-30 DIAGNOSIS — Z01419 Encounter for gynecological examination (general) (routine) without abnormal findings: Secondary | ICD-10-CM | POA: Diagnosis not present

## 2021-05-30 MED ORDER — NORGESTIM-ETH ESTRAD TRIPHASIC 0.18/0.215/0.25 MG-35 MCG PO TABS
ORAL_TABLET | ORAL | 3 refills | Status: DC
  Filled 2021-05-30 – 2021-07-20 (×2): qty 84, 84d supply, fill #0
  Filled 2021-10-15: qty 84, 84d supply, fill #1
  Filled 2021-12-20: qty 84, 84d supply, fill #2
  Filled 2022-03-29: qty 84, 84d supply, fill #3

## 2021-05-31 ENCOUNTER — Other Ambulatory Visit (HOSPITAL_COMMUNITY): Payer: Self-pay

## 2021-05-31 ENCOUNTER — Telehealth: Payer: Self-pay | Admitting: Allergy

## 2021-05-31 MED ORDER — BUDESONIDE-FORMOTEROL FUMARATE 160-4.5 MCG/ACT IN AERO
2.0000 | INHALATION_SPRAY | Freq: Two times a day (BID) | RESPIRATORY_TRACT | 1 refills | Status: DC
  Filled 2021-05-31: qty 10.2, 30d supply, fill #0
  Filled 2021-07-06: qty 10.2, 30d supply, fill #1

## 2021-05-31 NOTE — Addendum Note (Signed)
Addended by: Jaynie Crumble on: 05/31/2021 12:27 PM   Modules accepted: Orders

## 2021-05-31 NOTE — Telephone Encounter (Signed)
Patient states [facility] wouldn't refill her Symbicort. There was a miscommunication when scheduling her follow in April. Patient scheduled a 13monthfollow instead of a 2 month follow. Patient has an appointment on 08/02/2021 with Dr. PNelva Bushand is doing well. Patient would like to know if [facility] or if she needs to be seen earlier.   Patient is going out of town on Tuesday and needs more Symbicort.

## 2021-05-31 NOTE — Telephone Encounter (Signed)
Refill was sent in by [place].

## 2021-06-03 ENCOUNTER — Other Ambulatory Visit (HOSPITAL_COMMUNITY): Payer: Self-pay

## 2021-06-20 ENCOUNTER — Other Ambulatory Visit (HOSPITAL_COMMUNITY): Payer: Self-pay

## 2021-06-25 ENCOUNTER — Other Ambulatory Visit: Payer: Self-pay | Admitting: Obstetrics and Gynecology

## 2021-06-25 DIAGNOSIS — Z1231 Encounter for screening mammogram for malignant neoplasm of breast: Secondary | ICD-10-CM

## 2021-06-27 DIAGNOSIS — Z6836 Body mass index (BMI) 36.0-36.9, adult: Secondary | ICD-10-CM | POA: Diagnosis not present

## 2021-06-27 DIAGNOSIS — L918 Other hypertrophic disorders of the skin: Secondary | ICD-10-CM | POA: Diagnosis not present

## 2021-06-27 DIAGNOSIS — Z23 Encounter for immunization: Secondary | ICD-10-CM | POA: Diagnosis not present

## 2021-07-06 ENCOUNTER — Other Ambulatory Visit (HOSPITAL_COMMUNITY): Payer: Self-pay

## 2021-07-20 ENCOUNTER — Other Ambulatory Visit: Payer: Self-pay | Admitting: Allergy

## 2021-07-20 ENCOUNTER — Other Ambulatory Visit (HOSPITAL_COMMUNITY): Payer: Self-pay

## 2021-07-22 ENCOUNTER — Other Ambulatory Visit (HOSPITAL_COMMUNITY): Payer: Self-pay

## 2021-07-22 MED ORDER — MONTELUKAST SODIUM 10 MG PO TABS
10.0000 mg | ORAL_TABLET | Freq: Every day | ORAL | 0 refills | Status: DC
  Filled 2021-07-22: qty 90, 90d supply, fill #0

## 2021-07-23 ENCOUNTER — Other Ambulatory Visit: Payer: Self-pay | Admitting: Allergy

## 2021-07-23 ENCOUNTER — Other Ambulatory Visit (HOSPITAL_COMMUNITY): Payer: Self-pay

## 2021-08-02 ENCOUNTER — Encounter: Payer: Self-pay | Admitting: Allergy

## 2021-08-02 ENCOUNTER — Other Ambulatory Visit: Payer: Self-pay

## 2021-08-02 ENCOUNTER — Other Ambulatory Visit (HOSPITAL_COMMUNITY): Payer: Self-pay

## 2021-08-02 ENCOUNTER — Ambulatory Visit: Payer: 59 | Admitting: Allergy

## 2021-08-02 VITALS — BP 124/76 | HR 72 | Temp 97.8°F | Resp 16 | Ht 70.0 in | Wt 257.2 lb

## 2021-08-02 DIAGNOSIS — J3089 Other allergic rhinitis: Secondary | ICD-10-CM | POA: Diagnosis not present

## 2021-08-02 DIAGNOSIS — J4530 Mild persistent asthma, uncomplicated: Secondary | ICD-10-CM | POA: Diagnosis not present

## 2021-08-02 DIAGNOSIS — J302 Other seasonal allergic rhinitis: Secondary | ICD-10-CM

## 2021-08-02 DIAGNOSIS — K2100 Gastro-esophageal reflux disease with esophagitis, without bleeding: Secondary | ICD-10-CM

## 2021-08-02 NOTE — Patient Instructions (Addendum)
Asthma Continue montelukast 10 mg once a day to prevent cough or wheeze Continue Symbicort 160-2 puffs twice a day with a spacer to prevent cough or wheeze Continue albuterol 2 puffs every 4 hours as needed for cough or wheeze OR Instead use albuterol 0.083% solution via nebulizer one unit vial every 4 hours as needed for cough or wheeze  Allergic rhinitis Continue Xyzal 5 mg once a day as needed for runny nose or itch. Remember to rotate to a different antihistamine about every 3 months. Some examples of over the counter antihistamines include Zyrtec (cetirizine), Xyzal (levocetirizine), Allegra (fexofenadine), and Claritin (loratidine).  Try Ryaltris 2 sprays each nostril twice a day.  Sample provided.  This is a combo spray of Nasonex (nasal steroid for congestion) and Patanase (nasal antihistamine for drainage).   Hold Flonase and Asteline while using Ryaltris.   If you like Ryaltris and it is effective for drainage and congestion control let us know and we can send prescription to the specialty pharmacy.  Otherwise if Ryaltris not effective or then you can replace Flonase with Nasacort which is over the counter.  Use Astelin 2 sprays in each nostril twice a day as needed for runny nose Consider saline nasal rinses as needed for nasal symptoms. Use this before any medicated nasal sprays for best result Continue Atrovent nasal spray 2 sprays in each nostril up to 3 times a day as needed for runny nose Continue allergen avoidance measures directed toward grass pollen, weed pollen, mold, dust mite, cat, and cockroach If your symptoms are not controlled by medications, consider a course of allergen immunotherapy. We will need to update environmental skin testing before beginning allergen immunotherapy. Remember to stop antihistamines for 3 days before the skin testing appointment   Reflux Continue famotidine 20 mg twice a day to control reflux Continue dietary and lifestyle modifications as listed  below  Call the clinic if this treatment plan is not working well for you  Follow up in 4 months or sooner if needed.

## 2021-08-02 NOTE — Progress Notes (Signed)
Follow-up Note  RE: Regina Rice MRN: [id number] DOB: 06-Apr-1966 Date of Office Visit: 08/02/2021   History of present illness: Regina Rice is a 55 y.o. female presenting today for follow-up of asthma, allergic rhinitis, and reflux.  She was last seen in the office on 01/31/21 by our nurse practitioner Ambs.  At this visit she was treated for acute sinusitis with moxifloxacin and a 5-day steroid burst..  She states the weather changing and the cool to warm days she is having some increase in allergy symptoms.  She is noting more congestion and thick mucus in the mornings and increase in throat clearing.  She is also noting raspy voice.  She states her insurance stopped covering nasonex.  Thus she has flonase currently.  She does use astelin to help with nasal drainage control daily.  She will use nasal ipratropium when needed when drainage is worse.  She has not started using Mucinex but she feels like she may have too soon.  She is using Xyzal for her antihistamine. She did not need to use albuterol since her last visit.  She does continue on Symbicort 2 puffs twice a day and singular daily.  She has not required any ED or urgent care visits or any systemic steroid needs since her last visit.  Review of systems: Review of Systems  Constitutional: Negative.   HENT:  Positive for congestion.        See HPI  Eyes: Negative.   Respiratory: Negative.    Cardiovascular: Negative.   Gastrointestinal: Negative.   Musculoskeletal: Negative.   Skin: Negative.   Neurological: Negative.    All other systems negative unless noted above in HPI  Past medical/social/surgical/family history have been reviewed and are unchanged unless specifically indicated below.  No changes  Medication List: Current Outpatient Medications  Medication Sig Dispense Refill   albuterol (PROAIR HFA) 108 (90 Base) MCG/ACT inhaler Inhale 2 puffs into the lungs every 4 (four) hours as needed for wheezing or  shortness of breath. 18 g 3   albuterol (PROVENTIL) (2.5 MG/3ML) 0.083% nebulizer solution Take 3 mLs (2.5 mg total) by nebulization every 4 (four) hours as needed for wheezing or shortness of breath. 150 mL 1   Albuterol Sulfate (PROAIR RESPICLICK) 509 (90 Base) MCG/ACT AEPB Inhale 2 puffs into the lungs every 6 (six) hours as needed. 1 each 2   aspirin 81 MG tablet Take 81 mg by mouth daily.     Azelastine HCl 137 MCG/SPRAY SOLN Place 2 sprays into both nostrils 2 (two) times daily as needed. 30 mL 5   budesonide-formoterol (SYMBICORT) 160-4.5 MCG/ACT inhaler Inhale 2 puffs into the lungs 2 (two) times daily. 10.2 g 1   Cholecalciferol (VITAMIN D3) 25 MCG (1000 UT) CAPS Take by mouth daily.     famotidine (PEPCID) 20 MG tablet Take 1 tablet (20 mg total) by mouth 2 (two) times daily. 60 tablet 5   fexofenadine (ALLEGRA) 180 MG tablet Take 180 mg by mouth daily.     fluticasone (FLONASE) 50 MCG/ACT nasal spray Place 1 spray into both nostrils daily.     glucosamine-chondroitin 500-400 MG tablet Take 1 tablet by mouth 2 (two) times daily.     ipratropium (ATROVENT) 0.06 % nasal spray Place 2 sprays into both nostrils 3 (three) times daily. 15 mL 3   ipratropium (ATROVENT) 0.06 % nasal spray Place 2 sprays into both nostrils 3 (three) times daily. 45 mL 1   ipratropium-albuterol (DUONEB) 0.5-2.5 (3)  MG/3ML SOLN Take 3 mLs by nebulization every 4 (four) hours as needed (shorntess of breath, wheezing and chest tightness). 90 mL 0   levocetirizine (XYZAL) 5 MG tablet Take 1 tablet (5 mg total) by mouth every evening. 30 tablet 5   lisinopril (ZESTRIL) 10 MG tablet Take 1 tablet by mouth once a day for blood pressure 90 tablet 3   mometasone (NASONEX) 50 MCG/ACT nasal spray INSTILL 2 SPRAYS INTO EACH NOSTRIL DAILY 17 g 5   montelukast (SINGULAIR) 10 MG tablet Take 1 tablet (10 mg total) by mouth at bedtime. 90 tablet 0   moxifloxacin (AVELOX) 400 MG tablet One tablet daily for 10 days 10 tablet 0    Multiple Vitamin (MULTI-VITAMIN PO) Take by mouth.     Multiple Vitamins-Minerals (ZINC PO) Take by mouth daily.     Norgestimate-Ethinyl Estradiol Triphasic 0.18/0.215/0.25 MG-35 MCG tablet Take 1 tablet by mouth daily 84 tablet 3   rosuvastatin (CRESTOR) 10 MG tablet Take 1 tablet by mouth once a day for cholesterol 90 tablet 3   triamcinolone (NASACORT ALLERGY 24HR) 55 MCG/ACT AERO nasal inhaler Place 2 sprays into the nose daily. For congestion 1 each 4   TURMERIC PO Take by mouth daily.     lisinopril (ZESTRIL) 10 MG tablet TAKE 1 TABLET BY MOUTH ONCE DAILY FOR BLOOD PRESSURE (Patient taking differently: Take by mouth daily. for blood pressure) 90 tablet 3   Norgestimate-Ethinyl Estradiol Triphasic 0.18/0.215/0.25 MG-35 MCG tablet TAKE 1 TABLET BY MOUTH ONCE DAILY 84 tablet 3   rosuvastatin (CRESTOR) 10 MG tablet TAKE 1 TABLET BY MOUTH ONCE DAILY 90 tablet 3   No current facility-administered medications for this visit.     Known medication allergies: Allergies  Allergen Reactions   Septra [Sulfamethoxazole-Trimethoprim] Swelling   Amoxicillin Rash     Physical examination: Blood pressure 124/76, pulse 72, temperature 97.8 F (36.6 C), temperature source Temporal, resp. rate 16, height 5\' 10"  (1.778 m), weight 257 lb 4 oz (116.7 kg), SpO2 96 %.  General: Alert, interactive, in no acute distress. HEENT: PERRLA, TMs pearly gray, turbinates mildly edematous without discharge, post-pharynx non erythematous. Neck: Supple without lymphadenopathy. Lungs: Clear to auscultation without wheezing, rhonchi or rales. {no increased work of breathing. CV: Normal S1, S2 without murmurs. Abdomen: Nondistended, nontender. Skin: Warm and dry, without lesions or rashes. Extremities:  No clubbing, cyanosis or edema. Neuro:   Grossly intact.  Diagnositics/Labs:  Spirometry: FEV1: 3.32 L 97%, FVC: 3.88%, ratio consistent with nonobstructive pattern   Assessment and plan:   Asthma Continue  montelukast 10 mg once a day to prevent cough or wheeze Continue Symbicort 160-2 puffs twice a day with a spacer to prevent cough or wheeze Continue albuterol 2 puffs every 4 hours as needed for cough or wheeze OR Instead use albuterol 0.083% solution via nebulizer one unit vial every 4 hours as needed for cough or wheeze  Allergic rhinitis Continue Xyzal 5 mg once a day as needed for runny nose or itch. Remember to rotate to a different antihistamine about every 3 months. Some examples of over the counter antihistamines include Zyrtec (cetirizine), Xyzal (levocetirizine), Allegra (fexofenadine), and Claritin (loratidine).  Try Ryaltris 2 sprays each nostril twice a day.  Sample provided.  This is a combo spray of Nasonex (nasal steroid for congestion) and Patanase (nasal antihistamine for drainage).   Hold Flonase and Asteline while using Ryaltris.   If you like Ryaltris and it is effective for drainage and congestion control let us know  and we can send prescription to the specialty pharmacy.  Otherwise if Ryaltris not effective or then you can replace Flonase with Nasacort which is over the counter.  Use Astelin 2 sprays in each nostril twice a day as needed for runny nose Consider saline nasal rinses as needed for nasal symptoms. Use this before any medicated nasal sprays for best result Continue Atrovent nasal spray 2 sprays in each nostril up to 3 times a day as needed for runny nose Continue allergen avoidance measures directed toward grass pollen, weed pollen, mold, dust mite, cat, and cockroach If your symptoms are not controlled by medications, consider a course of allergen immunotherapy. We will need to update environmental skin testing before beginning allergen immunotherapy. Remember to stop antihistamines for 3 days before the skin testing appointment   Reflux Continue famotidine 20 mg twice a day to control reflux Continue dietary and lifestyle modifications as listed below  Call the  clinic if this treatment plan is not working well for you  Follow up in 4 months or sooner if needed.  I appreciate the opportunity to take part in Regina Rice's care. Please do not hesitate to contact me with questions.  Sincerely,   Prudy Feeler, MD Allergy/Immunology [facility]

## 2021-08-08 ENCOUNTER — Other Ambulatory Visit: Payer: Self-pay | Admitting: Allergy

## 2021-08-08 ENCOUNTER — Other Ambulatory Visit (HOSPITAL_COMMUNITY): Payer: Self-pay

## 2021-08-08 NOTE — Telephone Encounter (Signed)
Patient called stating she has been waiting since her appointment on 08/02/2021 for her refill on Symbicort.  Please advise to the refill request.

## 2021-08-09 ENCOUNTER — Other Ambulatory Visit: Payer: Self-pay

## 2021-08-09 ENCOUNTER — Ambulatory Visit
Admission: RE | Admit: 2021-08-09 | Discharge: 2021-08-09 | Disposition: A | Discharge status: Home / Self Care | Payer: 59 | Source: Ambulatory Visit

## 2021-08-09 ENCOUNTER — Other Ambulatory Visit (HOSPITAL_COMMUNITY): Payer: Self-pay

## 2021-08-09 DIAGNOSIS — Z1231 Encounter for screening mammogram for malignant neoplasm of breast: Secondary | ICD-10-CM | POA: Diagnosis not present

## 2021-08-09 MED ORDER — BUDESONIDE-FORMOTEROL FUMARATE 160-4.5 MCG/ACT IN AERO
2.0000 | INHALATION_SPRAY | Freq: Two times a day (BID) | RESPIRATORY_TRACT | 5 refills | Status: DC
  Filled 2021-08-09: qty 10.2, 30d supply, fill #0
  Filled 2021-09-10: qty 10.2, 30d supply, fill #1
  Filled 2021-10-15: qty 10.2, 30d supply, fill #2

## 2021-08-09 NOTE — Telephone Encounter (Signed)
Patient called to follow up on previous message. Patient states she is going out of town and is in need of her refill. Patient would like this sent in as soon as possible, as she had requested it be sent in at her last visit.   Patient would like a call when medication is sent in, [phone]

## 2021-08-09 NOTE — Telephone Encounter (Signed)
Refill has been sent in and patient was notified.

## 2021-08-24 ENCOUNTER — Other Ambulatory Visit (HOSPITAL_COMMUNITY): Payer: Self-pay

## 2021-09-10 ENCOUNTER — Other Ambulatory Visit (HOSPITAL_COMMUNITY): Payer: Self-pay

## 2021-09-30 ENCOUNTER — Other Ambulatory Visit (HOSPITAL_COMMUNITY): Payer: Self-pay

## 2021-10-15 ENCOUNTER — Other Ambulatory Visit (HOSPITAL_COMMUNITY): Payer: Self-pay

## 2021-10-24 ENCOUNTER — Ambulatory Visit: Payer: 59 | Admitting: Family Medicine

## 2021-10-24 ENCOUNTER — Other Ambulatory Visit (HOSPITAL_COMMUNITY): Payer: Self-pay

## 2021-10-24 ENCOUNTER — Other Ambulatory Visit: Payer: Self-pay

## 2021-10-24 ENCOUNTER — Encounter: Payer: Self-pay | Admitting: Family Medicine

## 2021-10-24 VITALS — BP 130/78 | HR 79 | Temp 98.0°F | Resp 12 | Ht 69.29 in | Wt 252.0 lb

## 2021-10-24 DIAGNOSIS — J4541 Moderate persistent asthma with (acute) exacerbation: Secondary | ICD-10-CM

## 2021-10-24 DIAGNOSIS — K2100 Gastro-esophageal reflux disease with esophagitis, without bleeding: Secondary | ICD-10-CM | POA: Diagnosis not present

## 2021-10-24 DIAGNOSIS — J3089 Other allergic rhinitis: Secondary | ICD-10-CM

## 2021-10-24 DIAGNOSIS — J0100 Acute maxillary sinusitis, unspecified: Secondary | ICD-10-CM

## 2021-10-24 DIAGNOSIS — J302 Other seasonal allergic rhinitis: Secondary | ICD-10-CM | POA: Diagnosis not present

## 2021-10-24 MED ORDER — AZELASTINE HCL 137 MCG/SPRAY NA SOLN
2.0000 | Freq: Two times a day (BID) | NASAL | 5 refills | Status: DC | PRN
  Filled 2021-10-24 – 2021-11-15 (×2): qty 30, 25d supply, fill #0
  Filled 2021-12-20: qty 30, 25d supply, fill #1
  Filled 2022-01-22: qty 30, 25d supply, fill #2
  Filled 2022-02-25: qty 30, 25d supply, fill #3
  Filled 2022-02-28: qty 30, 25d supply, fill #0
  Filled 2022-03-29: qty 30, 25d supply, fill #1
  Filled 2022-04-23: qty 30, 25d supply, fill #2

## 2021-10-24 MED ORDER — ALBUTEROL SULFATE HFA 108 (90 BASE) MCG/ACT IN AERS
2.0000 | INHALATION_SPRAY | RESPIRATORY_TRACT | 1 refills | Status: DC | PRN
Start: 2021-10-24 — End: 2023-07-02
  Filled 2021-10-24: qty 18, 16d supply, fill #0

## 2021-10-24 MED ORDER — BUDESONIDE-FORMOTEROL FUMARATE 160-4.5 MCG/ACT IN AERO
2.0000 | INHALATION_SPRAY | Freq: Two times a day (BID) | RESPIRATORY_TRACT | 5 refills | Status: DC
Start: 2021-10-24 — End: 2022-06-04
  Filled 2021-10-24 – 2021-11-15 (×2): qty 10.2, 30d supply, fill #0
  Filled 2021-12-20: qty 10.2, 30d supply, fill #1
  Filled 2022-01-22: qty 10.2, 30d supply, fill #2
  Filled 2022-03-17: qty 10.2, 30d supply, fill #3
  Filled 2022-04-23: qty 10.2, 30d supply, fill #0

## 2021-10-24 MED ORDER — MOXIFLOXACIN HCL 400 MG PO TABS
400.0000 mg | ORAL_TABLET | Freq: Every day | ORAL | 0 refills | Status: DC
  Filled 2021-10-24: qty 10, 10d supply, fill #0

## 2021-10-24 MED ORDER — IPRATROPIUM BROMIDE 0.06 % NA SOLN
2.0000 | Freq: Three times a day (TID) | NASAL | 3 refills | Status: DC
Start: 2021-10-24 — End: 2022-06-06
  Filled 2021-10-24 – 2021-12-20 (×2): qty 15, 25d supply, fill #0
  Filled 2022-02-25: qty 15, 25d supply, fill #1
  Filled 2022-02-28: qty 15, 13d supply, fill #0
  Filled 2022-03-29: qty 15, 13d supply, fill #1
  Filled 2022-04-23: qty 15, 13d supply, fill #2

## 2021-10-24 MED ORDER — MOMETASONE FUROATE 50 MCG/ACT NA SUSP
2.0000 | Freq: Every day | NASAL | 5 refills | Status: DC
  Filled 2021-10-24: qty 17, 30d supply, fill #0

## 2021-10-24 MED ORDER — MONTELUKAST SODIUM 10 MG PO TABS
10.0000 mg | ORAL_TABLET | Freq: Every day | ORAL | 1 refills | Status: DC
  Filled 2021-10-24 – 2021-10-29 (×2): qty 90, 90d supply, fill #0
  Filled 2022-01-22: qty 90, 90d supply, fill #1

## 2021-10-24 MED ORDER — FAMOTIDINE 20 MG PO TABS
20.0000 mg | ORAL_TABLET | Freq: Two times a day (BID) | ORAL | 5 refills | Status: DC
  Filled 2021-10-24: qty 60, 30d supply, fill #0

## 2021-10-24 NOTE — Patient Instructions (Addendum)
Acute sinusitis Begin moxifloxacin 400 one tablet once a day for 10 days Begin prednisone 10 mg tablets. Take 2 tablets once a day for 4 days, then take 1 tablet on the 5th day, then stop.  Continue nasal saline rinses followed by Nasonex daily Continue Mucinex 600 to 1200 mg twice a day and increase fluid intake in order to thin mucus Consider evaluation by ENT specialist for frequent sinus infections  Asthma Begin prednisone as listed above Continue montelukast 10 mg once a day to prevent cough or wheeze Continue Symbicort 160-2 puffs twice a day with a spacer to prevent cough or wheeze Continue albuterol 2 puffs every 4 hours as needed for cough or wheeze OR Instead use albuterol 0.083% solution via nebulizer one unit vial every 4 hours as needed for cough or wheeze  Allergic rhinitis Hold Xyzal for about 1 week, then continue Xyzal 5 mg once a day as needed for runny nose or itch. Remember to rotate to a different antihistamine about every 3 months. Some examples of over the counter antihistamines include Zyrtec (cetirizine), Xyzal (levocetirizine), Allegra (fexofenadine), and Claritin (loratidine).  Continue Nasonex 1 to 2 sprays in each nostril once a day as needed for stuffy nose.  In the right nostril, point the applicator out toward the right ear. In the left nostril, point the applicator out toward the left ear Consider saline nasal rinses as needed for nasal symptoms. Use this before any medicated nasal sprays for best result Hold azelastine for about 1 week, then continue azelastine 2 sprays in each nostril twice a day as needed for runny nose Hold Atrovent for about 1 week, then continue Atrovent nasal spray 2 sprays in each nostril up to 3 times a day as needed for runny nose For thick postnasal drainage, begin Mucinex 600 mg to 1200 mg twice a day as needed Continue allergen avoidance measures directed toward grass pollen, weed pollen, mold, dust mite, cat, and cockroach If your  symptoms are not controlled by medications, consider a course of allergen immunotherapy. We will need to update environmental skin testing before beginning allergen immunotherapy. Remember to stop antihistamines for 3 days before the skin testing appointment  Reflux Begin omeprazole 20 mg once a day to control reflux Continue famotidine 20 mg twice a day to control reflux Continue dietary and lifestyle modifications as listed below  Call the clinic if this treatment plan is not working well for you  Follow up in 2 months or sooner if needed.   Lifestyle Changes for Controlling GERD When you have GERD, stomach acid feels as if its backing up toward your mouth. Whether or not you take medication to control your GERD, your symptoms can often be improved with lifestyle changes.   Raise Your Head Reflux is more likely to strike when youre lying down flat, because stomach fluid can flow backward more easily. Raising the head of your bed 4-6 inches can help. To do this: Slide blocks or books under the legs at the head of your bed. Or, place a wedge under the mattress. Many foam stores can make a suitable wedge for you. The wedge should run from your waist to the top of your head. Dont just prop your head on several pillows. This increases pressure on your stomach. It can make GERD worse.  Watch Your Eating Habits Certain foods may increase the acid in your stomach or relax the lower esophageal sphincter, making GERD more likely. Its best to avoid the following: Coffee, tea,  and carbonated drinks (with and without caffeine) Fatty, fried, or spicy food Mint, chocolate, onions, and tomatoes Any other foods that seem to irritate your stomach or cause you pain  Relieve the Pressure Eat smaller meals, even if you have to eat more often. Dont lie down right after you eat. Wait a few hours for your stomach to empty. Avoid tight belts and tight-fitting clothes. Lose excess  weight.  Tobacco and Alcohol Avoid smoking tobacco and drinking alcohol. They can make GERD symptoms worse.

## 2021-10-24 NOTE — Progress Notes (Signed)
[facility] [place] [address] Dept: [phone]  FOLLOW UP NOTE  Patient ID: Regina Rice, female    DOB: June 14, 1966  Age: 55 y.o. MRN: [id number] Date of Office Visit: 10/24/2021  Assessment  Chief Complaint: Cough (Chest is tight in the front and back), Sinusitis (Green mucus when blowing nose, then yellowish color when she spits it out. Headache), and Asthma (ACT- 12)  HPI Regina Rice a 55 year old female who presents to the clinic for evaluation of sinus symptoms that began the last week in November and worsened over the last 2 weeks.  She was last seen in this clinic on 08/02/2021 by Dr. Nelva Bush for evaluation of asthma, allergic rhinitis, and reflux.  At today's visit, she reports her asthma has been poorly controlled with symptoms beginning over the last 2 weeks including shortness of breath with activity and rest, wheeze occurring especially with cough, and cough which is reported as dry and producing mucus occurring in the daytime and nighttime.  She continues montelukast 10 mg once a day, Symbicort 160-2 puffs twice a day, and albuterol via nebulizer twice a day for the last week with moderate relief of symptoms.  Allergic rhinitis is reported as poorly controlled with symptoms including thick green rhinorrhea, nasal congestion, and postnasal drainage.  She reports sore throat occurring intermittently over the past 2 weeks.  She continues Xyzol 5 mg once a day, Nasonex daily, azelastine daily, and ipratropium nasal spray daily.  She reports pressure and popping in both ears with occasional muffled hearing and intermittent pressure occurring over the last 1 to 2 weeks.  Reflux is reported as moderately well controlled with an increase in heartburn over the last 2 to 3 weeks.  She continues famotidine 20 mg twice a day.  Her current medications are listed in the chart.  Allergies  Allergen Reactions   Septra [Sulfamethoxazole-Trimethoprim] Swelling   Amoxicillin  Rash    Physical Exam: BP 130/78    Pulse 79    Temp 98 F (36.7 C) (Temporal)    Resp 12    Ht 5' 9.29" (1.76 m)    Wt 252 lb (114.3 kg)    SpO2 96%    BMI 36.90 kg/m    Physical Exam Vitals reviewed.  Constitutional:      Appearance: Normal appearance.  HENT:     Head: Normocephalic and atraumatic.     Right Ear: Tympanic membrane normal.     Left Ear: Tympanic membrane normal.     Nose:     Comments: Bilateral nares slightly erythematous with clear nasal drainage noted.  Pharynx erythematous with no exudate.  Ears normal.  Eyes normal.    Mouth/Throat:     Pharynx: Oropharynx is clear.  Eyes:     Conjunctiva/sclera: Conjunctivae normal.  Cardiovascular:     Rate and Rhythm: Normal rate and regular rhythm.     Heart sounds: Normal heart sounds. No murmur heard. Pulmonary:     Effort: Pulmonary effort is normal.     Breath sounds: Normal breath sounds.     Comments: Lungs clear to auscultation Musculoskeletal:        General: Normal range of motion.     Cervical back: Normal range of motion and neck supple.  Skin:    General: Skin is warm and dry.  Neurological:     Mental Status: She is alert and oriented to person, place, and time.  Psychiatric:        Mood and Affect:  Mood normal.        Behavior: Behavior normal.        Thought Content: Thought content normal.        Judgment: Judgment normal.    Diagnostics: FVC 3.60, FEV1 3.15.  Predicted FVC 3.98, predicted FEV1 3.13.  Spirometry indicates normal ventilatory function.  Assessment and Plan: 1. Moderate persistent asthma with acute exacerbation   2. Seasonal and perennial allergic rhinitis   3. Acute maxillary sinusitis, recurrence not specified   4. Gastroesophageal reflux disease with esophagitis, unspecified whether hemorrhage     Meds ordered this encounter  Medications   moxifloxacin (AVELOX) 400 MG tablet    Sig: Take 1 tablet (400 mg total) by mouth daily for 10 days    Dispense:  10 tablet     Refill:  0   montelukast (SINGULAIR) 10 MG tablet    Sig: Take 1 tablet (10 mg total) by mouth at bedtime.    Dispense:  90 tablet    Refill:  1   budesonide-formoterol (SYMBICORT) 160-4.5 MCG/ACT inhaler    Sig: Inhale 2 puffs into the lungs 2 (two) times daily.    Dispense:  10 g    Refill:  5   albuterol (VENTOLIN HFA) 108 (90 Base) MCG/ACT inhaler    Sig: Inhale 2 puffs into the lungs every 4 (four) hours as needed for wheezing or shortness of breath.    Dispense:  18 g    Refill:  1   mometasone (NASONEX) 50 MCG/ACT nasal spray    Sig: INSTILL 2 SPRAYS INTO EACH NOSTRIL DAILY    Dispense:  17 g    Refill:  5   Azelastine HCl 137 MCG/SPRAY SOLN    Sig: Place 2 sprays into both nostrils 2 (two) times daily as needed.    Dispense:  30 mL    Refill:  5   ipratropium (ATROVENT) 0.06 % nasal spray    Sig: Place 2 sprays into both nostrils 3 (three) times daily.    Dispense:  15 mL    Refill:  3   famotidine (PEPCID) 20 MG tablet    Sig: Take 1 tablet (20 mg total) by mouth 2 (two) times daily.    Dispense:  60 tablet    Refill:  5    Patient Instructions  Acute sinusitis Begin moxifloxacin 400 one tablet once a day for 10 days Begin prednisone 10 mg tablets. Take 2 tablets once a day for 4 days, then take 1 tablet on the 5th day, then stop.  Continue nasal saline rinses followed by Nasonex daily Continue Mucinex 600 to 1200 mg twice a day and increase fluid intake in order to thin mucus Consider evaluation by ENT specialist for frequent sinus infections  Asthma Begin prednisone as listed above Continue montelukast 10 mg once a day to prevent cough or wheeze Continue Symbicort 160-2 puffs twice a day with a spacer to prevent cough or wheeze Continue albuterol 2 puffs every 4 hours as needed for cough or wheeze OR Instead use albuterol 0.083% solution via nebulizer one unit vial every 4 hours as needed for cough or wheeze  Allergic rhinitis Hold Xyzal for about 1 week, then  continue Xyzal 5 mg once a day as needed for runny nose or itch. Remember to rotate to a different antihistamine about every 3 months. Some examples of over the counter antihistamines include Zyrtec (cetirizine), Xyzal (levocetirizine), Allegra (fexofenadine), and Claritin (loratidine).  Continue Nasonex 1 to 2 sprays in  each nostril once a day as needed for stuffy nose.  In the right nostril, point the applicator out toward the right ear. In the left nostril, point the applicator out toward the left ear Consider saline nasal rinses as needed for nasal symptoms. Use this before any medicated nasal sprays for best result Hold azelastine for about 1 week, then continue azelastine 2 sprays in each nostril twice a day as needed for runny nose Hold Atrovent for about 1 week, then continue Atrovent nasal spray 2 sprays in each nostril up to 3 times a day as needed for runny nose For thick postnasal drainage, begin Mucinex 600 mg to 1200 mg twice a day as needed Continue allergen avoidance measures directed toward grass pollen, weed pollen, mold, dust mite, cat, and cockroach If your symptoms are not controlled by medications, consider a course of allergen immunotherapy. We will need to update environmental skin testing before beginning allergen immunotherapy. Remember to stop antihistamines for 3 days before the skin testing appointment  Reflux Begin omeprazole 20 mg once a day to control reflux Continue famotidine 20 mg twice a day to control reflux Continue dietary and lifestyle modifications as listed below  Call the clinic if this treatment plan is not working well for you  Follow up in 2 months or sooner if needed.  Return in about 2 months (around 12/24/2021), or if symptoms worsen or fail to improve.    Thank you for the opportunity to care for this patient.  Please do not hesitate to contact me with questions.  Gareth Morgan, FNP [facility]

## 2021-10-25 ENCOUNTER — Other Ambulatory Visit (HOSPITAL_COMMUNITY): Payer: Self-pay

## 2021-10-29 ENCOUNTER — Other Ambulatory Visit (HOSPITAL_COMMUNITY): Payer: Self-pay

## 2021-11-04 ENCOUNTER — Other Ambulatory Visit (HOSPITAL_COMMUNITY): Payer: Self-pay

## 2021-11-04 DIAGNOSIS — H40013 Open angle with borderline findings, low risk, bilateral: Secondary | ICD-10-CM | POA: Diagnosis not present

## 2021-11-15 ENCOUNTER — Other Ambulatory Visit (HOSPITAL_COMMUNITY): Payer: Self-pay

## 2021-12-05 ENCOUNTER — Other Ambulatory Visit: Payer: Self-pay

## 2021-12-05 ENCOUNTER — Encounter: Payer: Self-pay | Admitting: Allergy

## 2021-12-05 ENCOUNTER — Ambulatory Visit (INDEPENDENT_AMBULATORY_CARE_PROVIDER_SITE_OTHER): Payer: 59 | Admitting: Allergy

## 2021-12-05 ENCOUNTER — Other Ambulatory Visit (HOSPITAL_COMMUNITY): Payer: Self-pay

## 2021-12-05 VITALS — BP 130/88 | HR 84 | Temp 97.6°F | Resp 16 | Ht 70.0 in | Wt 258.4 lb

## 2021-12-05 DIAGNOSIS — J3089 Other allergic rhinitis: Secondary | ICD-10-CM

## 2021-12-05 DIAGNOSIS — K2100 Gastro-esophageal reflux disease with esophagitis, without bleeding: Secondary | ICD-10-CM

## 2021-12-05 DIAGNOSIS — J4540 Moderate persistent asthma, uncomplicated: Secondary | ICD-10-CM | POA: Diagnosis not present

## 2021-12-05 DIAGNOSIS — J329 Chronic sinusitis, unspecified: Secondary | ICD-10-CM | POA: Diagnosis not present

## 2021-12-05 DIAGNOSIS — J302 Other seasonal allergic rhinitis: Secondary | ICD-10-CM

## 2021-12-05 MED ORDER — ALBUTEROL SULFATE (2.5 MG/3ML) 0.083% IN NEBU
2.5000 mg | INHALATION_SOLUTION | RESPIRATORY_TRACT | 1 refills | Status: DC | PRN
Start: 2021-12-05 — End: 2023-07-02
  Filled 2021-12-05: qty 150, 9d supply, fill #0

## 2021-12-05 NOTE — Patient Instructions (Addendum)
Recurrent sinusitis At this time doing well but you have had significant amount of sinus infections requiring antibiotics and/or prednisone to treat thus will perform an immunocompetence screen to evaluate your immune system  Asthma Continue montelukast 10 mg once a day to prevent cough or wheeze Continue Symbicort 160-2 puffs twice a day with a spacer to prevent cough or wheeze Continue albuterol 2 puffs every 4 hours as needed for cough or wheeze OR Instead use albuterol 0.083% solution via nebulizer one unit vial every 4 hours as needed for cough or wheeze  Allergic rhinitis Continue Xyzal 5 mg once a day as needed for runny nose or itch. Remember to rotate to a different antihistamine about every 3 months. Some examples of over the counter antihistamines include Zyrtec (cetirizine), Xyzal (levocetirizine), Allegra (fexofenadine), and Claritin (loratidine).  Continue Nasonex 1 to 2 sprays in each nostril once a day as needed for stuffy nose.  In the right nostril, point the applicator out toward the right ear. In the left nostril, point the applicator out toward the left ear Continue Azelastine 2 sprays in each nostril twice a day as needed for runny nose Continue Atrovent (ipratropium) nasal spray 2 sprays in each nostril up to 3 times a day as needed for runny nose For thick postnasal drainage, begin Mucinex 600 mg to 1200 mg twice a day as needed Continue allergen avoidance measures directed toward grass pollen, weed pollen, mold, dust mite, cat, and cockroach Continue use of steamer device for humidification of the sinus tract  Reflux Continue Omeprazole 20 mg once a day to control reflux Continue famotidine 20 mg twice a day to control reflux Continue dietary and lifestyle modifications as listed below   Follow up in 6 months or sooner if needed.

## 2021-12-05 NOTE — Progress Notes (Signed)
Follow-up Note  RE: Regina Rice MRN: [id number] DOB: May 26, 1966 Date of Office Visit: 12/05/2021   History of present illness: Regina Rice is a 56 y.o. female presenting today for follow-up of asthma, allergic rhinitis and reflux.  She was last seen in the office on 10/24/21 by our nurse practitioner Ambs.  At that visit she was treated for another sinus infection with moxifloxacin and prednisone burst. She did improve from the sinus infection.  She has not had any significant symptoms since.  She states she does have more congestion and drainage that can lead to a cough in the mornings.  She does use her Nasonex as well as azelastine for maintenance of symptoms.  She will use.  Nasal ipratropium spray as needed for drainage that does help.  She coughs a lot less with the use of the ipratropium spray.  She also use an antihistamine like Xyzal. Her asthma has been doing well without need for albuterol use.  She continues to take Symbicort 160 mcg 2 puffs twice a day as well as Singulair daily.  She has not had any further prednisone courses since the last visit.  No ED or urgent care visits for her asthma. She does take antireflux medications with a PPI and H2 blocker. Upon review of her visits she has had 2 sinus infections with antibiotic and prednisone treatment thus far within just this past year (not counting from previous years).    Review of systems: Review of Systems  Constitutional: Negative.   HENT:  Positive for congestion and postnasal drip.   Eyes: Negative.   Respiratory:  Positive for cough.   Cardiovascular: Negative.   Gastrointestinal: Negative.   Musculoskeletal: Negative.   Skin: Negative.   Allergic/Immunologic: Negative.   Neurological: Negative.     All other systems negative unless noted above in HPI  Past medical/social/surgical/family history have been reviewed and are unchanged unless specifically indicated below.  No changes  Medication  List: Current Outpatient Medications  Medication Sig Dispense Refill   albuterol (VENTOLIN HFA) 108 (90 Base) MCG/ACT inhaler Inhale 2 puffs into the lungs every 4 (four) hours as needed for wheezing or shortness of breath. 18 g 1   aspirin 81 MG tablet Take 81 mg by mouth daily.     Azelastine HCl 137 MCG/SPRAY SOLN Place 2 sprays into both nostrils 2 (two) times daily as needed. 30 mL 5   budesonide-formoterol (SYMBICORT) 160-4.5 MCG/ACT inhaler Inhale 2 puffs into the lungs 2 (two) times daily. 10 g 5   Cholecalciferol (VITAMIN D3) 25 MCG (1000 UT) CAPS Take by mouth daily.     famotidine (PEPCID) 20 MG tablet Take 1 tablet (20 mg total) by mouth 2 (two) times daily. 60 tablet 5   fexofenadine (ALLEGRA) 180 MG tablet Take 180 mg by mouth daily.     fluticasone (FLONASE) 50 MCG/ACT nasal spray Place 1 spray into both nostrils daily.     glucosamine-chondroitin 500-400 MG tablet Take 1 tablet by mouth 2 (two) times daily.     ipratropium (ATROVENT) 0.06 % nasal spray Place 2 sprays into both nostrils 3 (three) times daily. 15 mL 3   ipratropium-albuterol (DUONEB) 0.5-2.5 (3) MG/3ML SOLN Take 3 mLs by nebulization every 4 (four) hours as needed (shorntess of breath, wheezing and chest tightness). 90 mL 0   levocetirizine (XYZAL) 5 MG tablet Take 1 tablet (5 mg total) by mouth every evening. 30 tablet 5   lisinopril (ZESTRIL) 10 MG tablet Take  1 tablet by mouth once a day for blood pressure 90 tablet 3   Melatonin Gummies 2.5 MG CHEW Chew 3 mg by mouth in the morning and at bedtime.     mometasone (NASONEX) 50 MCG/ACT nasal spray Place 2 sprays into the nose daily. 17 g 5   montelukast (SINGULAIR) 10 MG tablet Take 1 tablet (10 mg total) by mouth at bedtime. 90 tablet 1   Multiple Vitamin (MULTI-VITAMIN PO) Take by mouth.     Norgestimate-Ethinyl Estradiol Triphasic 0.18/0.215/0.25 MG-35 MCG tablet Take 1 tablet by mouth daily 84 tablet 3   rosuvastatin (CRESTOR) 10 MG tablet Take 1 tablet by  mouth once a day for cholesterol 90 tablet 3   TURMERIC PO Take by mouth daily.     ZINC SULFATE PO Take 50 mg by mouth daily.     albuterol (PROVENTIL) (2.5 MG/3ML) 0.083% nebulizer solution Inhale 1 vial (2.5 mg total) by nebulization every 4 (four) hours as needed for wheezing or shortness of breath. 150 mL 1   lisinopril (ZESTRIL) 10 MG tablet TAKE 1 TABLET BY MOUTH ONCE DAILY FOR BLOOD PRESSURE (Patient taking differently: Take by mouth daily. for blood pressure) 90 tablet 3   Norgestimate-Ethinyl Estradiol Triphasic 0.18/0.215/0.25 MG-35 MCG tablet TAKE 1 TABLET BY MOUTH ONCE DAILY 84 tablet 3   No current facility-administered medications for this visit.     Known medication allergies: Allergies  Allergen Reactions   Septra [Sulfamethoxazole-Trimethoprim] Swelling   Amoxicillin Rash     Physical examination: Blood pressure 130/88, pulse 84, temperature 97.6 F (36.4 C), resp. rate 16, height 5\' 10"  (1.778 m), weight 258 lb 6.4 oz (117.2 kg), SpO2 98 %.  General: Alert, interactive, in no acute distress. HEENT: PERRLA, TMs pearly gray, turbinates minimally edematous without discharge, post-pharynx non erythematous. Neck: Supple without lymphadenopathy. Lungs: Clear to auscultation without wheezing, rhonchi or rales. {no increased work of breathing. CV: Normal S1, S2 without murmurs. Abdomen: Nondistended, nontender. Skin: Warm and dry, without lesions or rashes. Extremities:  No clubbing, cyanosis or edema. Neuro:   Grossly intact.  Diagnositics/Labs:  Spirometry: FEV1: 3.53L 106%, FVC: 4.12L 97%, ratio consistent with nonobstructive pattern  Assessment and plan:   Recurrent sinusitis At this time doing well but you have had significant amount of sinus infections requiring antibiotics and/or prednisone to treat thus will perform an immunocompetence screen to evaluate your immune system  Asthma Continue montelukast 10 mg once a day to prevent cough or wheeze Continue  Symbicort 160-2 puffs twice a day with a spacer to prevent cough or wheeze Continue albuterol 2 puffs every 4 hours as needed for cough or wheeze OR Instead use albuterol 0.083% solution via nebulizer one unit vial every 4 hours as needed for cough or wheeze  Allergic rhinitis Continue Xyzal 5 mg once a day as needed for runny nose or itch. Remember to rotate to a different antihistamine about every 3 months. Some examples of over the counter antihistamines include Zyrtec (cetirizine), Xyzal (levocetirizine), Allegra (fexofenadine), and Claritin (loratidine).  Continue Nasonex 1 to 2 sprays in each nostril once a day as needed for stuffy nose.  In the right nostril, point the applicator out toward the right ear. In the left nostril, point the applicator out toward the left ear Continue Azelastine 2 sprays in each nostril twice a day as needed for runny nose Continue Atrovent (ipratropium) nasal spray 2 sprays in each nostril up to 3 times a day as needed for runny nose For thick postnasal  drainage, begin Mucinex 600 mg to 1200 mg twice a day as needed Continue allergen avoidance measures directed toward grass pollen, weed pollen, mold, dust mite, cat, and cockroach Continue use of steamer device for humidification of the sinus tract  Reflux Continue Omeprazole 20 mg once a day to control reflux Continue famotidine 20 mg twice a day to control reflux Continue dietary and lifestyle modifications as listed below   Follow up in 6 months or sooner if needed.   I appreciate the opportunity to take part in Rosielee's care. Please do not hesitate to contact me with questions.  Sincerely,   Prudy Feeler, MD Allergy/Immunology [facility]

## 2021-12-12 LAB — CBC WITH DIFFERENTIAL/PLATELET
Basophils Absolute: 0 10*3/uL (ref 0.0–0.2)
Basos: 0 %
EOS (ABSOLUTE): 0.1 10*3/uL (ref 0.0–0.4)
Eos: 1 %
Hematocrit: 39.2 % (ref 34.0–46.6)
Hemoglobin: 12.9 g/dL (ref 11.1–15.9)
Immature Grans (Abs): 0 10*3/uL (ref 0.0–0.1)
Immature Granulocytes: 0 %
Lymphocytes Absolute: 2.1 10*3/uL (ref 0.7–3.1)
Lymphs: 25 %
MCH: 29.1 pg (ref 26.6–33.0)
MCHC: 32.9 g/dL (ref 31.5–35.7)
MCV: 88 fL (ref 79–97)
Monocytes Absolute: 0.6 10*3/uL (ref 0.1–0.9)
Monocytes: 7 %
Neutrophils Absolute: 5.6 10*3/uL (ref 1.4–7.0)
Neutrophils: 67 %
Platelets: 297 10*3/uL (ref 150–450)
RBC: 4.44 x10E6/uL (ref 3.77–5.28)
RDW: 13.4 % (ref 11.7–15.4)
WBC: 8.5 10*3/uL (ref 3.4–10.8)

## 2021-12-12 LAB — COMPLEMENT, TOTAL: Compl, Total (CH50): >60 U/mL (ref >41)

## 2021-12-12 LAB — STREP PNEUMONIAE 23 SEROTYPES IGG
Pneumo Ab Type 1*: 0.2 ug/mL — ABNORMAL LOW (ref >1.3)
Pneumo Ab Type 12 (12F)*: <0.1 ug/mL — ABNORMAL LOW (ref >1.3)
Pneumo Ab Type 14*: 0.9 ug/mL — ABNORMAL LOW (ref >1.3)
Pneumo Ab Type 17 (17F)*: 1.4 ug/mL (ref >1.3)
Pneumo Ab Type 19 (19F)*: 2.2 ug/mL (ref >1.3)
Pneumo Ab Type 2*: 0.5 ug/mL — ABNORMAL LOW (ref >1.3)
Pneumo Ab Type 20*: 1.4 ug/mL (ref >1.3)
Pneumo Ab Type 22 (22F)*: 0.3 ug/mL — ABNORMAL LOW (ref >1.3)
Pneumo Ab Type 23 (23F)*: 0.3 ug/mL — ABNORMAL LOW (ref >1.3)
Pneumo Ab Type 26 (6B)*: 0.7 ug/mL — ABNORMAL LOW (ref >1.3)
Pneumo Ab Type 3*: 0.7 ug/mL — ABNORMAL LOW (ref >1.3)
Pneumo Ab Type 34 (10A)*: 2.9 ug/mL (ref >1.3)
Pneumo Ab Type 4*: <0.1 ug/mL — ABNORMAL LOW (ref >1.3)
Pneumo Ab Type 43 (11A)*: 0.5 ug/mL — ABNORMAL LOW (ref >1.3)
Pneumo Ab Type 5*: <0.1 ug/mL — ABNORMAL LOW (ref >1.3)
Pneumo Ab Type 51 (7F)*: 0.4 ug/mL — ABNORMAL LOW (ref >1.3)
Pneumo Ab Type 54 (15B)*: 5.6 ug/mL (ref >1.3)
Pneumo Ab Type 56 (18C)*: 0.8 ug/mL — ABNORMAL LOW (ref >1.3)
Pneumo Ab Type 57 (19A)*: 1.6 ug/mL (ref >1.3)
Pneumo Ab Type 68 (9V)*: 0.2 ug/mL — ABNORMAL LOW (ref >1.3)
Pneumo Ab Type 70 (33F)*: 7.6 ug/mL (ref >1.3)
Pneumo Ab Type 8*: 0.2 ug/mL — ABNORMAL LOW (ref >1.3)
Pneumo Ab Type 9 (9N)*: 2.4 ug/mL (ref >1.3)

## 2021-12-12 LAB — DIPHTHERIA / TETANUS ANTIBODY PANEL
Diphtheria Ab: 1.89 IU/mL (ref <0.10)
Tetanus Ab, IgG: >7 IU/mL (ref <0.10)

## 2021-12-12 LAB — IGE: IgE (Immunoglobulin E), Serum: 40 IU/mL (ref 6–495)

## 2021-12-12 LAB — IGG, IGA, IGM
IgA/Immunoglobulin A, Serum: 247 mg/dL (ref 87–352)
IgG (Immunoglobin G), Serum: 1010 mg/dL (ref 586–1602)
IgM (Immunoglobulin M), Srm: 94 mg/dL (ref 26–217)

## 2021-12-18 ENCOUNTER — Telehealth: Payer: Self-pay | Admitting: *Deleted

## 2021-12-18 DIAGNOSIS — J329 Chronic sinusitis, unspecified: Secondary | ICD-10-CM

## 2021-12-18 NOTE — Telephone Encounter (Signed)
Repeat labs for after Pneumovax vaccine.

## 2021-12-19 ENCOUNTER — Ambulatory Visit (INDEPENDENT_AMBULATORY_CARE_PROVIDER_SITE_OTHER): Payer: 59 | Admitting: *Deleted

## 2021-12-19 ENCOUNTER — Other Ambulatory Visit: Payer: Self-pay

## 2021-12-19 DIAGNOSIS — B999 Unspecified infectious disease: Secondary | ICD-10-CM

## 2021-12-19 DIAGNOSIS — Z23 Encounter for immunization: Secondary | ICD-10-CM | POA: Diagnosis not present

## 2021-12-20 ENCOUNTER — Other Ambulatory Visit (HOSPITAL_COMMUNITY): Payer: Self-pay

## 2021-12-23 ENCOUNTER — Other Ambulatory Visit (HOSPITAL_COMMUNITY): Payer: Self-pay

## 2022-01-19 ENCOUNTER — Telehealth: Payer: 59 | Admitting: Nurse Practitioner

## 2022-01-19 DIAGNOSIS — U071 COVID-19: Secondary | ICD-10-CM | POA: Diagnosis not present

## 2022-01-19 MED ORDER — PSEUDOEPH-BROMPHEN-DM 30-2-10 MG/5ML PO SYRP: 5.0000 mL | ORAL_SOLUTION | Freq: Four times a day (QID) | ORAL | 0 refills | Status: AC | PRN

## 2022-01-19 MED ORDER — MOLNUPIRAVIR EUA 200MG CAPSULE
4.0000 | ORAL_CAPSULE | Freq: Two times a day (BID) | ORAL | 0 refills | Status: AC
Start: 2022-01-19 — End: 2022-01-24

## 2022-01-19 NOTE — Patient Instructions (Signed)
?Delila Spence Pulcini, thank you for joining Gildardo Pounds, NP for today's virtual visit.  While this provider is not your primary care provider (PCP), if your PCP is located in our provider database this encounter information will be shared with them immediately following your visit. ? ?Consent: ?(Patient) Erion Hermans Petruska provided verbal consent for this virtual visit at the beginning of the encounter. ? ?Current Medications: ? ?Current Outpatient Medications:  ?  brompheniramine-pseudoephedrine-DM 30-2-10 MG/5ML syrup, Take 5 mLs by mouth 4 (four) times daily as needed., Disp: 240 mL, Rfl: 0 ?  molnupiravir EUA (LAGEVRIO) 200 mg CAPS capsule, Take 4 capsules (800 mg total) by mouth 2 (two) times daily for 5 days., Disp: 40 capsule, Rfl: 0 ?  albuterol (PROVENTIL) (2.5 MG/3ML) 0.083% nebulizer solution, Inhale 1 vial (2.5 mg total) by nebulization every 4 (four) hours as needed for wheezing or shortness of breath., Disp: 150 mL, Rfl: 1 ?  albuterol (VENTOLIN HFA) 108 (90 Base) MCG/ACT inhaler, Inhale 2 puffs into the lungs every 4 (four) hours as needed for wheezing or shortness of breath., Disp: 18 g, Rfl: 1 ?  aspirin 81 MG tablet, Take 81 mg by mouth daily., Disp: , Rfl:  ?  Azelastine HCl 137 MCG/SPRAY SOLN, Place 2 sprays into both nostrils 2 (two) times daily as needed., Disp: 30 mL, Rfl: 5 ?  budesonide-formoterol (SYMBICORT) 160-4.5 MCG/ACT inhaler, Inhale 2 puffs into the lungs 2 (two) times daily., Disp: 10 g, Rfl: 5 ?  Cholecalciferol (VITAMIN D3) 25 MCG (1000 UT) CAPS, Take by mouth daily., Disp: , Rfl:  ?  famotidine (PEPCID) 20 MG tablet, Take 1 tablet (20 mg total) by mouth 2 (two) times daily., Disp: 60 tablet, Rfl: 5 ?  fexofenadine (ALLEGRA) 180 MG tablet, Take 180 mg by mouth daily., Disp: , Rfl:  ?  fluticasone (FLONASE) 50 MCG/ACT nasal spray, Place 1 spray into both nostrils daily., Disp: , Rfl:  ?  glucosamine-chondroitin 500-400 MG tablet, Take 1 tablet by mouth 2 (two) times daily.,  Disp: , Rfl:  ?  ipratropium (ATROVENT) 0.06 % nasal spray, Place 2 sprays into both nostrils 3 (three) times daily., Disp: 15 mL, Rfl: 3 ?  ipratropium-albuterol (DUONEB) 0.5-2.5 (3) MG/3ML SOLN, Take 3 mLs by nebulization every 4 (four) hours as needed (shorntess of breath, wheezing and chest tightness)., Disp: 90 mL, Rfl: 0 ?  levocetirizine (XYZAL) 5 MG tablet, Take 1 tablet (5 mg total) by mouth every evening., Disp: 30 tablet, Rfl: 5 ?  lisinopril (ZESTRIL) 10 MG tablet, TAKE 1 TABLET BY MOUTH ONCE DAILY FOR BLOOD PRESSURE (Patient taking differently: Take by mouth daily. for blood pressure), Disp: 90 tablet, Rfl: 3 ?  lisinopril (ZESTRIL) 10 MG tablet, Take 1 tablet by mouth once a day for blood pressure, Disp: 90 tablet, Rfl: 3 ?  Melatonin Gummies 2.5 MG CHEW, Chew 3 mg by mouth in the morning and at bedtime., Disp: , Rfl:  ?  mometasone (NASONEX) 50 MCG/ACT nasal spray, Place 2 sprays into the nose daily., Disp: 17 g, Rfl: 5 ?  montelukast (SINGULAIR) 10 MG tablet, Take 1 tablet (10 mg total) by mouth at bedtime., Disp: 90 tablet, Rfl: 1 ?  Multiple Vitamin (MULTI-VITAMIN PO), Take by mouth., Disp: , Rfl:  ?  Norgestimate-Ethinyl Estradiol Triphasic 0.18/0.215/0.25 MG-35 MCG tablet, TAKE 1 TABLET BY MOUTH ONCE DAILY, Disp: 84 tablet, Rfl: 3 ?  Norgestimate-Ethinyl Estradiol Triphasic 0.18/0.215/0.25 MG-35 MCG tablet, Take 1 tablet by mouth daily, Disp: 84 tablet, Rfl:  3 ?  rosuvastatin (CRESTOR) 10 MG tablet, Take 1 tablet by mouth once a day for cholesterol, Disp: 90 tablet, Rfl: 3 ?  TURMERIC PO, Take by mouth daily., Disp: , Rfl:  ?  ZINC SULFATE PO, Take 50 mg by mouth daily., Disp: , Rfl:   ? ?Medications ordered in this encounter:  ?Meds ordered this encounter  ?Medications  ? molnupiravir EUA (LAGEVRIO) 200 mg CAPS capsule  ?  Sig: Take 4 capsules (800 mg total) by mouth 2 (two) times daily for 5 days.  ?  Dispense:  40 capsule  ?  Refill:  0  ?  Order Specific Question:   Supervising Provider  ?   Answer:   Noemi Chapel [[id number]]  ? brompheniramine-pseudoephedrine-DM 30-2-10 MG/5ML syrup  ?  Sig: Take 5 mLs by mouth 4 (four) times daily as needed.  ?  Dispense:  240 mL  ?  Refill:  0  ?  Order Specific Question:   Supervising Provider  ?  Answer:   Noemi Chapel [[id number]]  ?  ? ?*If you need refills on other medications prior to your next appointment, please contact your pharmacy* ? ?Follow-Up: ?Call back or seek an in-person evaluation if the symptoms worsen or if the condition fails to improve as anticipated. ? ?Other Instructions ?Please keep well-hydrated and get plenty of rest. ?Start a saline nasal rinse to flush out your nasal passages. ?You can use plain Mucinex to help thin congestion. ?If you have a humidifier, running in the bedroom at night. ?I want you to start OTC vitamin D3 1000 units daily, vitamin C 1000 mg daily, and a zinc supplement. ?Please take prescribed medications as directed. ?   ?If PCR test positive, please see quarantine instructions below. I also want you to message me via MyChart, using the separate message I have sent you. This way you can communicate directly with me.  ?  ?You were to quarantine for 5 days from onset of your symptoms.  After day 5, if you have had no fever and you are feeling better, you can end quarantine but need to mask for an additional 5 days. ?After day 5 if you have a fever or are having significant symptoms, please quarantine for full 10 days. ?  ?If you note any worsening of symptoms, any significant shortness of breath or any chest pain, please seek ER evaluation ASAP.  Please do not delay care!  ? ? ?If you have been instructed to have an in-person evaluation today at a local Urgent Care facility, please use the link below. It will take you to a list of all of our available [facility], including address, phone number and hours of operation. Please do not delay care.  ?[facility] ? ?If you or a family member do not have a  primary care provider, use the link below to schedule a visit and establish care. When you choose a [facility] primary care physician or advanced practice provider, you gain a long-term partner in health. ?Find a Primary Care Provider ? ?Learn more about 's in-office and virtual care options: ?[facility]  ?

## 2022-01-19 NOTE — Progress Notes (Signed)
?Virtual Visit Consent  ? ?Regina Rice, you are scheduled for a virtual visit with a [facility] provider today.   ?  ?Just as with appointments in the office, your consent must be obtained to participate.  Your consent will be active for this visit and any virtual visit you may have with one of our providers in the next 365 days.   ?  ?If you have a MyChart account, a copy of this consent can be sent to you electronically.  All virtual visits are billed to your insurance company just like a traditional visit in the office.   ? ?As this is a virtual visit, video technology does not allow for your provider to perform a traditional examination.  This may limit your provider's ability to fully assess your condition.  If your provider identifies any concerns that need to be evaluated in person or the need to arrange testing (such as labs, EKG, etc.), we will make arrangements to do so.   ?  ?Although advances in technology are sophisticated, we cannot ensure that it will always work on either your end or our end.  If the connection with a video visit is poor, the visit may have to be switched to a telephone visit.  With either a video or telephone visit, we are not always able to ensure that we have a secure connection.    ? ?I need to obtain your verbal consent now.   Are you willing to proceed with your visit today?  ?  ?Regina Rice has provided verbal consent on 01/19/2022 for a virtual visit (video or telephone). ?  ?Gildardo Pounds, NP  ? ?Date: 01/19/2022 4:24 PM ? ? ?Virtual Visit via Video Note  ? ?I, Gildardo Pounds, connected with  Regina Rice  ([id number], [birth year]) on 01/19/22 at  4:15 PM EDT by a video-enabled telemedicine application and verified that I am speaking with the correct person using two identifiers. ? ?Location: ?Patient: Virtual Visit Location Patient: Home ?Provider: Virtual Visit Location Provider: Home Office ?  ?I discussed the limitations of evaluation and management by  telemedicine and the availability of in person appointments. The patient expressed understanding and agreed to proceed.   ? ?History of Present Illness: ?Regina Rice is a 56 y.o. who identifies as a female who was assigned female at birth, and is being seen today for Positive COVID test. ? ?Onset of symptoms late Thursday starting with productive cough and now with low grade fever 99.9-100.8, chest and nasal congestion, and frontal headache. Home COVID test was positive today. ?Problems:  ?Patient Active Problem List  ? Diagnosis Date Noted  ? Moderate persistent asthma 06/21/2020  ? Allergic conjunctivitis of both eyes 06/21/2020  ? Gastroesophageal reflux disease with esophagitis 06/21/2020  ? Gastroesophageal reflux disease 12/31/2018  ? Mild persistent asthma, uncomplicated 22/63/3354  ? Allergic rhinitis due to allergen 08/25/2016  ?  ?Allergies:  ?Allergies  ?Allergen Reactions  ? Septra [Sulfamethoxazole-Trimethoprim] Swelling  ? Amoxicillin Rash  ? ?Medications:  ?Current Outpatient Medications:  ?  brompheniramine-pseudoephedrine-DM 30-2-10 MG/5ML syrup, Take 5 mLs by mouth 4 (four) times daily as needed., Disp: 240 mL, Rfl: 0 ?  molnupiravir EUA (LAGEVRIO) 200 mg CAPS capsule, Take 4 capsules (800 mg total) by mouth 2 (two) times daily for 5 days., Disp: 40 capsule, Rfl: 0 ?  albuterol (PROVENTIL) (2.5 MG/3ML) 0.083% nebulizer solution, Inhale 1 vial (2.5 mg total) by nebulization every 4 (four) hours as needed for  wheezing or shortness of breath., Disp: 150 mL, Rfl: 1 ?  albuterol (VENTOLIN HFA) 108 (90 Base) MCG/ACT inhaler, Inhale 2 puffs into the lungs every 4 (four) hours as needed for wheezing or shortness of breath., Disp: 18 g, Rfl: 1 ?  aspirin 81 MG tablet, Take 81 mg by mouth daily., Disp: , Rfl:  ?  Azelastine HCl 137 MCG/SPRAY SOLN, Place 2 sprays into both nostrils 2 (two) times daily as needed., Disp: 30 mL, Rfl: 5 ?  budesonide-formoterol (SYMBICORT) 160-4.5 MCG/ACT inhaler, Inhale 2  puffs into the lungs 2 (two) times daily., Disp: 10 g, Rfl: 5 ?  Cholecalciferol (VITAMIN D3) 25 MCG (1000 UT) CAPS, Take by mouth daily., Disp: , Rfl:  ?  famotidine (PEPCID) 20 MG tablet, Take 1 tablet (20 mg total) by mouth 2 (two) times daily., Disp: 60 tablet, Rfl: 5 ?  fexofenadine (ALLEGRA) 180 MG tablet, Take 180 mg by mouth daily., Disp: , Rfl:  ?  fluticasone (FLONASE) 50 MCG/ACT nasal spray, Place 1 spray into both nostrils daily., Disp: , Rfl:  ?  glucosamine-chondroitin 500-400 MG tablet, Take 1 tablet by mouth 2 (two) times daily., Disp: , Rfl:  ?  ipratropium (ATROVENT) 0.06 % nasal spray, Place 2 sprays into both nostrils 3 (three) times daily., Disp: 15 mL, Rfl: 3 ?  ipratropium-albuterol (DUONEB) 0.5-2.5 (3) MG/3ML SOLN, Take 3 mLs by nebulization every 4 (four) hours as needed (shorntess of breath, wheezing and chest tightness)., Disp: 90 mL, Rfl: 0 ?  levocetirizine (XYZAL) 5 MG tablet, Take 1 tablet (5 mg total) by mouth every evening., Disp: 30 tablet, Rfl: 5 ?  lisinopril (ZESTRIL) 10 MG tablet, TAKE 1 TABLET BY MOUTH ONCE DAILY FOR BLOOD PRESSURE (Patient taking differently: Take by mouth daily. for blood pressure), Disp: 90 tablet, Rfl: 3 ?  lisinopril (ZESTRIL) 10 MG tablet, Take 1 tablet by mouth once a day for blood pressure, Disp: 90 tablet, Rfl: 3 ?  Melatonin Gummies 2.5 MG CHEW, Chew 3 mg by mouth in the morning and at bedtime., Disp: , Rfl:  ?  mometasone (NASONEX) 50 MCG/ACT nasal spray, Place 2 sprays into the nose daily., Disp: 17 g, Rfl: 5 ?  montelukast (SINGULAIR) 10 MG tablet, Take 1 tablet (10 mg total) by mouth at bedtime., Disp: 90 tablet, Rfl: 1 ?  Multiple Vitamin (MULTI-VITAMIN PO), Take by mouth., Disp: , Rfl:  ?  Norgestimate-Ethinyl Estradiol Triphasic 0.18/0.215/0.25 MG-35 MCG tablet, TAKE 1 TABLET BY MOUTH ONCE DAILY, Disp: 84 tablet, Rfl: 3 ?  Norgestimate-Ethinyl Estradiol Triphasic 0.18/0.215/0.25 MG-35 MCG tablet, Take 1 tablet by mouth daily, Disp: 84 tablet,  Rfl: 3 ?  rosuvastatin (CRESTOR) 10 MG tablet, Take 1 tablet by mouth once a day for cholesterol, Disp: 90 tablet, Rfl: 3 ?  TURMERIC PO, Take by mouth daily., Disp: , Rfl:  ?  ZINC SULFATE PO, Take 50 mg by mouth daily., Disp: , Rfl:  ? ?Observations/Objective: ?Patient is well-developed, well-nourished in no acute distress.  ?Resting comfortably  at home.  ?Head is normocephalic, atraumatic.  ?No labored breathing.  ?Speech is clear and coherent with logical content.  ?Patient is alert and oriented at baseline.  ? ? ?Assessment and Plan: ?1. Positive self-administered antigen test for COVID-19 ?- molnupiravir EUA (LAGEVRIO) 200 mg CAPS capsule; Take 4 capsules (800 mg total) by mouth 2 (two) times daily for 5 days.  Dispense: 40 capsule; Refill: 0 ?- brompheniramine-pseudoephedrine-DM 30-2-10 MG/5ML syrup; Take 5 mLs by mouth 4 (four) times daily  as needed.  Dispense: 240 mL; Refill: 0 ?Please keep well-hydrated and get plenty of rest. ?Start a saline nasal rinse to flush out your nasal passages. ?You can use plain Mucinex to help thin congestion. ?If you have a humidifier, running in the bedroom at night. ?I want you to start OTC vitamin D3 1000 units daily, vitamin C 1000 mg daily, and a zinc supplement. ?Please take prescribed medications as directed. ?  ?You have been enrolled in a MyChart symptom monitoring program. ?Please answer these questions daily so we can keep track of how you are doing. ?  ?If PCR test positive, please see quarantine instructions below. I also want you to message me via MyChart, using the separate message I have sent you. This way you can communicate directly with me.  ?  ?You were to quarantine for 5 days from onset of your symptoms.  After day 5, if you have had no fever and you are feeling better, you can end quarantine but need to mask for an additional 5 days. ?After day 5 if you have a fever or are having significant symptoms, please quarantine for full 10 days. ?  ?If you note  any worsening of symptoms, any significant shortness of breath or any chest pain, please seek ER evaluation ASAP.  Please do not delay care!  ? ?Follow Up Instructions: ?I discussed the assessment an

## 2022-01-23 ENCOUNTER — Other Ambulatory Visit (HOSPITAL_COMMUNITY): Payer: Self-pay

## 2022-01-29 ENCOUNTER — Telehealth: Payer: Self-pay | Admitting: Allergy

## 2022-01-29 ENCOUNTER — Other Ambulatory Visit (HOSPITAL_COMMUNITY): Payer: Self-pay

## 2022-01-29 NOTE — Telephone Encounter (Signed)
Please advise recommendation for the patient.  ?

## 2022-01-29 NOTE — Telephone Encounter (Signed)
Patient called and said that she had covid on march 23 and she still has a cough and using her neb. And inhalers. But she said that she needs prednisone to help the cough. [facility]. [phone]. ?

## 2022-01-30 ENCOUNTER — Other Ambulatory Visit (HOSPITAL_COMMUNITY): Payer: Self-pay

## 2022-01-30 DIAGNOSIS — J329 Chronic sinusitis, unspecified: Secondary | ICD-10-CM | POA: Diagnosis not present

## 2022-01-30 MED ORDER — PREDNISONE 10 MG PO TABS
ORAL_TABLET | ORAL | 0 refills | Status: DC
  Filled 2022-01-30: qty 15, 5d supply, fill #0

## 2022-01-30 NOTE — Telephone Encounter (Signed)
Prednisone has been sent in. Called patient and informed, patient verbalized understanding.  ?

## 2022-02-04 LAB — STREP PNEUMONIAE 23 SEROTYPES IGG
Pneumo Ab Type 1*: 11.9 ug/mL (ref >1.3)
Pneumo Ab Type 12 (12F)*: 1.9 ug/mL (ref >1.3)
Pneumo Ab Type 14*: >18.7 ug/mL (ref >1.3)
Pneumo Ab Type 17 (17F)*: >20.2 ug/mL (ref >1.3)
Pneumo Ab Type 19 (19F)*: >35.9 ug/mL (ref >1.3)
Pneumo Ab Type 2*: >20.7 ug/mL (ref >1.3)
Pneumo Ab Type 20*: 12.2 ug/mL (ref >1.3)
Pneumo Ab Type 22 (22F)*: 4 ug/mL (ref >1.3)
Pneumo Ab Type 23 (23F)*: 1.7 ug/mL (ref >1.3)
Pneumo Ab Type 26 (6B)*: 13.4 ug/mL (ref >1.3)
Pneumo Ab Type 3*: 2.5 ug/mL (ref >1.3)
Pneumo Ab Type 34 (10A)*: >19.5 ug/mL (ref >1.3)
Pneumo Ab Type 4*: 2.8 ug/mL (ref >1.3)
Pneumo Ab Type 43 (11A)*: 3.4 ug/mL (ref >1.3)
Pneumo Ab Type 5*: 7.4 ug/mL (ref >1.3)
Pneumo Ab Type 51 (7F)*: >13.6 ug/mL (ref >1.3)
Pneumo Ab Type 54 (15B)*: 10.9 ug/mL (ref >1.3)
Pneumo Ab Type 56 (18C)*: >8.1 ug/mL (ref >1.3)
Pneumo Ab Type 57 (19A)*: 7.8 ug/mL (ref >1.3)
Pneumo Ab Type 68 (9V)*: 2.3 ug/mL (ref >1.3)
Pneumo Ab Type 70 (33F)*: >10.1 ug/mL (ref >1.3)
Pneumo Ab Type 8*: 14.4 ug/mL (ref >1.3)
Pneumo Ab Type 9 (9N)*: >21.7 ug/mL (ref >1.3)

## 2022-02-07 DIAGNOSIS — M84374A Stress fracture, right foot, initial encounter for fracture: Secondary | ICD-10-CM | POA: Diagnosis not present

## 2022-02-25 ENCOUNTER — Other Ambulatory Visit (HOSPITAL_COMMUNITY): Payer: Self-pay

## 2022-02-28 ENCOUNTER — Other Ambulatory Visit (HOSPITAL_COMMUNITY): Payer: Self-pay

## 2022-03-17 ENCOUNTER — Other Ambulatory Visit (HOSPITAL_COMMUNITY): Payer: Self-pay

## 2022-03-29 ENCOUNTER — Other Ambulatory Visit (HOSPITAL_COMMUNITY): Payer: Self-pay

## 2022-03-31 ENCOUNTER — Other Ambulatory Visit (HOSPITAL_COMMUNITY): Payer: Self-pay

## 2022-03-31 DIAGNOSIS — Z0000 Encounter for general adult medical examination without abnormal findings: Secondary | ICD-10-CM | POA: Diagnosis not present

## 2022-03-31 DIAGNOSIS — R7303 Prediabetes: Secondary | ICD-10-CM | POA: Diagnosis not present

## 2022-03-31 DIAGNOSIS — E7800 Pure hypercholesterolemia, unspecified: Secondary | ICD-10-CM | POA: Diagnosis not present

## 2022-03-31 DIAGNOSIS — I10 Essential (primary) hypertension: Secondary | ICD-10-CM | POA: Diagnosis not present

## 2022-03-31 DIAGNOSIS — J45909 Unspecified asthma, uncomplicated: Secondary | ICD-10-CM | POA: Diagnosis not present

## 2022-03-31 MED ORDER — LISINOPRIL 10 MG PO TABS
ORAL_TABLET | ORAL | 3 refills | Status: DC
  Filled 2022-03-31: qty 90, 90d supply, fill #0
  Filled 2022-07-20: qty 90, 90d supply, fill #1
  Filled 2022-10-16: qty 90, 90d supply, fill #2
  Filled 2023-01-15: qty 90, 90d supply, fill #3

## 2022-03-31 MED ORDER — ROSUVASTATIN CALCIUM 10 MG PO TABS
ORAL_TABLET | ORAL | 3 refills | Status: DC
  Filled 2022-03-31 – 2022-05-29 (×2): qty 90, 90d supply, fill #0
  Filled 2022-08-23: qty 90, 90d supply, fill #1
  Filled 2022-11-21: qty 90, 90d supply, fill #2
  Filled 2023-02-14: qty 90, 90d supply, fill #3

## 2022-04-01 ENCOUNTER — Other Ambulatory Visit (HOSPITAL_COMMUNITY): Payer: Self-pay

## 2022-04-05 ENCOUNTER — Other Ambulatory Visit (HOSPITAL_COMMUNITY): Payer: Self-pay

## 2022-04-23 ENCOUNTER — Other Ambulatory Visit: Payer: Self-pay | Admitting: Family Medicine

## 2022-04-23 ENCOUNTER — Other Ambulatory Visit (HOSPITAL_COMMUNITY): Payer: Self-pay

## 2022-04-23 MED ORDER — MONTELUKAST SODIUM 10 MG PO TABS
10.0000 mg | ORAL_TABLET | Freq: Every day | ORAL | 1 refills | Status: DC
  Filled 2022-04-23: qty 90, 90d supply, fill #0
  Filled 2022-07-20: qty 90, 90d supply, fill #1

## 2022-04-28 DIAGNOSIS — H5213 Myopia, bilateral: Secondary | ICD-10-CM | POA: Diagnosis not present

## 2022-04-28 DIAGNOSIS — H524 Presbyopia: Secondary | ICD-10-CM | POA: Diagnosis not present

## 2022-04-28 DIAGNOSIS — H52203 Unspecified astigmatism, bilateral: Secondary | ICD-10-CM | POA: Diagnosis not present

## 2022-04-28 DIAGNOSIS — H40013 Open angle with borderline findings, low risk, bilateral: Secondary | ICD-10-CM | POA: Diagnosis not present

## 2022-05-30 ENCOUNTER — Other Ambulatory Visit (HOSPITAL_COMMUNITY): Payer: Self-pay

## 2022-06-03 ENCOUNTER — Other Ambulatory Visit (HOSPITAL_COMMUNITY)
Admission: RE | Admit: 2022-06-03 | Discharge: 2022-06-03 | Disposition: A | Discharge status: Home / Self Care | Payer: 59 | Source: Ambulatory Visit | Attending: Obstetrics and Gynecology | Admitting: Obstetrics and Gynecology

## 2022-06-03 ENCOUNTER — Other Ambulatory Visit: Payer: Self-pay | Admitting: Obstetrics and Gynecology

## 2022-06-03 DIAGNOSIS — Z01419 Encounter for gynecological examination (general) (routine) without abnormal findings: Secondary | ICD-10-CM | POA: Insufficient documentation

## 2022-06-03 DIAGNOSIS — Z8262 Family history of osteoporosis: Secondary | ICD-10-CM | POA: Diagnosis not present

## 2022-06-03 DIAGNOSIS — S92909A Unspecified fracture of unspecified foot, initial encounter for closed fracture: Secondary | ICD-10-CM | POA: Diagnosis not present

## 2022-06-04 ENCOUNTER — Encounter: Payer: Self-pay | Admitting: Allergy

## 2022-06-04 ENCOUNTER — Ambulatory Visit: Payer: 59 | Admitting: Allergy

## 2022-06-04 ENCOUNTER — Other Ambulatory Visit (HOSPITAL_COMMUNITY): Payer: Self-pay

## 2022-06-04 VITALS — BP 124/76 | HR 70 | Temp 97.8°F | Resp 16 | Ht 70.0 in | Wt 249.5 lb

## 2022-06-04 DIAGNOSIS — J302 Other seasonal allergic rhinitis: Secondary | ICD-10-CM

## 2022-06-04 DIAGNOSIS — J3089 Other allergic rhinitis: Secondary | ICD-10-CM

## 2022-06-04 DIAGNOSIS — J4540 Moderate persistent asthma, uncomplicated: Secondary | ICD-10-CM | POA: Diagnosis not present

## 2022-06-04 DIAGNOSIS — K2100 Gastro-esophageal reflux disease with esophagitis, without bleeding: Secondary | ICD-10-CM

## 2022-06-04 DIAGNOSIS — J329 Chronic sinusitis, unspecified: Secondary | ICD-10-CM

## 2022-06-04 MED ORDER — BUDESONIDE-FORMOTEROL FUMARATE 160-4.5 MCG/ACT IN AERO
2.0000 | INHALATION_SPRAY | Freq: Two times a day (BID) | RESPIRATORY_TRACT | 5 refills | Status: DC
  Filled 2022-06-04: qty 10.2, 30d supply, fill #0
  Filled 2022-07-02: qty 10.2, 30d supply, fill #1
  Filled 2022-08-04: qty 10.2, 30d supply, fill #2
  Filled 2022-09-05: qty 10.2, 30d supply, fill #3
  Filled 2022-10-04: qty 10.2, 30d supply, fill #4
  Filled 2022-11-06: qty 10.2, 30d supply, fill #5

## 2022-06-04 NOTE — Progress Notes (Signed)
Follow-up Note  RE: Regina Rice MRN: [id number] DOB: 1966/01/21 Date of Office Visit: 06/04/2022   History of present illness: Regina Rice is a 56 y.o. female presenting today for follow-up of asthma, allergic rhinitis, reflux and recurrent sinusitis. She was lats seen in the office on 12/05/21 by myself. At least visit did do an immunocompetence screen and she did have a 35% protective titer rate for strep pneumonia which is low and recommended she receive pneumovax.  She did get pneumovax and had repeat titers about 4-6 weeks after vaccination that should a great response with a 100% protective rate.  He has not had any further infections since the last visit and thus no antibiotic needs.  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  This will be the test once can start back in the classroom to see how she does with her illnesses. She did have COVID around April states it was much milder than previous COVID illness that was mostly sinus symptoms. With her asthma she states it was not really effective when she had COVID this year.  Her symptoms have been well-controlled with montelukast and Symbicort use.  She does Symbicort 2 puffs twice a day.  She states pretty infrequent use of her albuterol.  She does state some days she had more symptoms when the air quality was worse as well as very humid days.  She has not had any urgent care or ED visits for systemic steroids since her last visit for her asthma. She did change from Xyzal to Allegra due to regular rotation of her antihistamines.  She states the spring she did notice more congestion.  She states she did start using her Atrovent at night more.  She typically takes it in the morning.  She also has used nasal steroid spray like Nasonex as well as azelastine antihistamine spray. With her reflux she is currently just taking famotidine twice a day.  She does note  that tomato products can drive her reflux symptoms.  She used to take omeprazole but has been trying not to take it as a long-term medication.  Review of systems in the past 4 weeks: Review of Systems  Constitutional: Negative.   HENT: Negative.    Eyes: Negative.   Respiratory: Negative.    Cardiovascular: Negative.   Gastrointestinal: Negative.   Musculoskeletal: Negative.   Skin: Negative.   Allergic/Immunologic: Negative.   Neurological: Negative.      All other systems negative unless noted above in HPI  Past medical/social/surgical/family history have been reviewed and are unchanged unless specifically indicated below.  No changes  Medication List: Current Outpatient Medications  Medication Sig Dispense Refill   albuterol (PROVENTIL) (2.5 MG/3ML) 0.083% nebulizer solution Inhale 1 vial (2.5 mg total) by nebulization every 4 (four) hours as needed for wheezing or shortness of breath. 150 mL 1   albuterol (VENTOLIN HFA) 108 (90 Base) MCG/ACT inhaler Inhale 2 puffs into the lungs every 4 (four) hours as needed for wheezing or shortness of breath. 18 g 1   aspirin 81 MG tablet Take 81 mg by mouth daily.     Azelastine HCl 137 MCG/SPRAY SOLN Place 2 sprays into both nostrils 2 (two) times daily as needed. 30 mL 5   brompheniramine-pseudoephedrine-DM 30-2-10 MG/5ML syrup Take 5 mLs by mouth 4 (four) times daily as needed. 240 mL 0   Cholecalciferol (VITAMIN D3) 25  MCG (1000 UT) CAPS Take by mouth daily.     famotidine (PEPCID) 20 MG tablet Take 1 tablet (20 mg total) by mouth 2 (two) times daily. 60 tablet 5   fexofenadine (ALLEGRA) 180 MG tablet Take 180 mg by mouth daily.     fluticasone (FLONASE) 50 MCG/ACT nasal spray Place 1 spray into both nostrils daily.     glucosamine-chondroitin 500-400 MG tablet Take 1 tablet by mouth 2 (two) times daily.     ipratropium (ATROVENT) 0.06 % nasal spray Place 2 sprays into both nostrils 3 (three) times daily. 15 mL 3    ipratropium-albuterol (DUONEB) 0.5-2.5 (3) MG/3ML SOLN Take 3 mLs by nebulization every 4 (four) hours as needed (shorntess of breath, wheezing and chest tightness). 90 mL 0   levocetirizine (XYZAL) 5 MG tablet Take 1 tablet (5 mg total) by mouth every evening. 30 tablet 5   lisinopril (ZESTRIL) 10 MG tablet Take 1 tablet by mouth once a day for blood pressure 90 tablet 3   Melatonin Gummies 2.5 MG CHEW Chew 3 mg by mouth in the morning and at bedtime.     mometasone (NASONEX) 50 MCG/ACT nasal spray Place 2 sprays into the nose daily. 17 g 5   montelukast (SINGULAIR) 10 MG tablet Take 1 tablet (10 mg total) by mouth at bedtime. 90 tablet 1   Multiple Vitamin (MULTI-VITAMIN PO) Take by mouth.     Norgestimate-Ethinyl Estradiol Triphasic 0.18/0.215/0.25 MG-35 MCG tablet TAKE 1 TABLET BY MOUTH ONCE DAILY 84 tablet 3   Norgestimate-Ethinyl Estradiol Triphasic 0.18/0.215/0.25 MG-35 MCG tablet Take 1 tablet by mouth daily 84 tablet 3   predniSONE (DELTASONE) 10 MG tablet Take 2 tablets by mouth 2 times daily for 3 days, then 2 tablets on day 4, then 1 tablet on day 5, then stop. 15 tablet 0   rosuvastatin (CRESTOR) 10 MG tablet Take 1 tablet by mouth once a day for cholesterol 90 tablet 3   TURMERIC PO Take by mouth daily.     ZINC SULFATE PO Take 50 mg by mouth daily.     budesonide-formoterol (SYMBICORT) 160-4.5 MCG/ACT inhaler Inhale 2 puffs into the lungs 2 times daily. 10.2 g 5   lisinopril (ZESTRIL) 10 MG tablet TAKE 1 TABLET BY MOUTH ONCE DAILY FOR BLOOD PRESSURE (Patient taking differently: Take by mouth daily. for blood pressure) 90 tablet 3   No current facility-administered medications for this visit.     Known medication allergies: Allergies  Allergen Reactions   Septra [Sulfamethoxazole-Trimethoprim] Swelling   Amoxicillin Rash     Physical examination: Blood pressure 124/76, pulse 70, temperature 97.8 F (36.6 C), resp. rate 16, height '5\' 10"'$  (1.778 m), weight 249 lb 8 oz (113.2  kg), SpO2 97 %.  General: Alert, interactive, in no acute distress. HEENT: PERRLA, TMs pearly gray, turbinates minimally edematous without discharge, post-pharynx non erythematous. Neck: Supple without lymphadenopathy. Lungs: Clear to auscultation without wheezing, rhonchi or rales. {no increased work of breathing. CV: Normal S1, S2 without murmurs. Abdomen: Nondistended, nontender. Skin: Warm and dry, without lesions or rashes. Extremities:  No clubbing, cyanosis or edema. Neuro:   Grossly intact.  Diagnositics/Labs: Labs:  Component     Latest Ref Rng 12/05/2021 01/30/2022  Pneumo Ab Type 1*     >1.3 ug/mL 0.2 (L)  11.9   Pneumo Ab Type 3*     >1.3 ug/mL 0.7 (L)  2.5   Pneumo Ab Type 4*     >1.3 ug/mL <0.1 (L)  2.8  Pneumo Ab Type 8*     >1.3 ug/mL 0.2 (L)  14.4   Pneumo Ab Type 9 (9N)*     >1.3 ug/mL 2.4  >21.7   Pneumo Ab Type 12 (59F)*     >1.3 ug/mL <0.1 (L)  1.9   Pneumo Ab Type 14*     >1.3 ug/mL 0.9 (L)  >18.7   Pneumo Ab Type 17 (71F)*     >1.3 ug/mL 1.4  >20.2   Pneumo Ab Type 19 (61F)*     >1.3 ug/mL 2.2  >35.9   Pneumo Ab Type 2*     >1.3 ug/mL 0.5 (L)  >20.7   Pneumo Ab Type 20*     >1.3 ug/mL 1.4  12.2   Pneumo Ab Type 22 (75F)*     >1.3 ug/mL 0.3 (L)  4.0   Pneumo Ab Type 23 (41F)*     >1.3 ug/mL 0.3 (L)  1.7   Pneumo Ab Type 26 (6B)*     >1.3 ug/mL 0.7 (L)  13.4   Pneumo Ab Type 34 (10A)*     >1.3 ug/mL 2.9  >19.5   Pneumo Ab Type 43 (11A)*     >1.3 ug/mL 0.5 (L)  3.4   Pneumo Ab Type 5*     >1.3 ug/mL <0.1 (L)  7.4   Pneumo Ab Type 51 (52F)*     >1.3 ug/mL 0.4 (L)  >13.6   Pneumo Ab Type 54 (15B)*     >1.3 ug/mL 5.6  10.9   Pneumo Ab Type 56 (18C)*     >1.3 ug/mL 0.8 (L)  >8.1   Pneumo Ab Type 57 (19A)*     >1.3 ug/mL 1.6  7.8   Pneumo Ab Type 68 (9V)*     >1.3 ug/mL 0.2 (L)  2.3   Pneumo Ab Type 70 (99F)*     >1.3 ug/mL 7.6  >10.1   WBC     3.4 - 10.8 x10E3/uL 8.5    RBC     3.77 - 5.28 x10E6/uL 4.44    Hemoglobin     11.1 - 15.9 g/dL  12.9    HCT     34.0 - 46.6 % 39.2    MCV     79 - 97 fL 88    MCH     26.6 - 33.0 pg 29.1    MCHC     31.5 - 35.7 g/dL 32.9    RDW     11.7 - 15.4 % 13.4    Platelets     150 - 450 x10E3/uL 297    Neutrophils     Not Estab. % 67    Lymphs     Not Estab. % 25    Monocytes     Not Estab. % 7    Eos     Not Estab. % 1    Basos     Not Estab. % 0    NEUT#     1.4 - 7.0 x10E3/uL 5.6    Lymphocyte #     0.7 - 3.1 x10E3/uL 2.1    Monocytes Absolute     0.1 - 0.9 x10E3/uL 0.6    EOS (ABSOLUTE)     0.0 - 0.4 x10E3/uL 0.1    Basophils Absolute     0.0 - 0.2 x10E3/uL 0.0    Immature Granulocytes     Not Estab. % 0    Immature Grans (Abs)     0.0 - 0.1 x10E3/uL 0.0  IgG (Immunoglobin G), Serum     586 - 1,602 mg/dL 1,010    IgA/Immunoglobulin A, Serum     87 - 352 mg/dL 247    IgM (Immunoglobulin M), Srm     26 - 217 mg/dL 94    Tetanus Ab, IgG     <0.10 IU/mL >7.00    Diphtheria Ab     <0.10 IU/mL 1.89    Compl, Total (CH50)     >41 U/mL >60    IgE (Immunoglobulin E), Serum     6 - 495 IU/mL 40       Spirometry: FEV1: 3.51L 109%, FVC: 4.08L 100%, ratio consistent with nonobstructive pattern  Assessment and plan:   Recurrent sinusitis Immunocompetence screen showed a 35% protective titer rate for strep pneumonia which is low.  Pneumovax recommended and received.  There was a great response to pneumovax now with a 100% protective rate.   Hopefully she will see less sinus infections now that she has a better protection.  Asthma Continue montelukast 10 mg once a day to prevent cough or wheeze Continue Symbicort 160-2 puffs twice a day with a spacer to prevent cough or wheeze Continue albuterol 2 puffs every 4 hours as needed for cough or wheeze OR Instead use albuterol 0.083% solution via nebulizer one unit vial every 4 hours as needed for cough or wheeze Lung function looks great today  Asthma control goals:  Full participation in all desired activities (may  need albuterol before activity) Albuterol use two time or less a week on average (not counting use with activity) Cough interfering with sleep two time or less a month Oral steroids no more than once a year No hospitalizations  Allergic rhinitis Continue Allegra once a day as needed for runny nose or itch. Remember to rotate to a different antihistamine about every 3-6 months between Allegra and Xyzal.  Continue Nasonex 2 sprays in each nostril once a day as needed for 1-2 weeks at a time for maximum benefit for congestion control.  Continue Azelastine 2 sprays in each nostril twice a day as needed for runny nose Continue Atrovent (ipratropium) nasal spray 2 sprays in each nostril up to 3 times a day as needed for runny nose In the right nostril, point the applicator out toward the right ear. In the left nostril, point the applicator out toward the left ear Continue allergen avoidance measures directed toward grass pollen, weed pollen, mold, dust mite, cat, and cockroach  Reflux Continue famotidine 20 mg twice a day to control reflux Can use Omeprazole 20 mg once a day to control reflux for about 6-8 week durations for increased reflux symptoms Continue dietary and lifestyle modifications as listed below   Follow up in 6 months or sooner if needed.  I appreciate the opportunity to take part in Skilar's care. Please do not hesitate to contact me with questions.  Sincerely,   Prudy Feeler, MD Allergy/Immunology [facility]

## 2022-06-04 NOTE — Patient Instructions (Addendum)
Recurrent sinusitis Immunocompetence screen showed a 35% protective titer rate for strep pneumonia which is low.  Pneumovax recommended and received.  There was a great response to pneumovax now with a 100% protective rate.    Asthma Continue montelukast 10 mg once a day to prevent cough or wheeze Continue Symbicort 160-2 puffs twice a day with a spacer to prevent cough or wheeze Continue albuterol 2 puffs every 4 hours as needed for cough or wheeze OR Instead use albuterol 0.083% solution via nebulizer one unit vial every 4 hours as needed for cough or wheeze Lung function looks great today  Asthma control goals:  Full participation in all desired activities (may need albuterol before activity) Albuterol use two time or less a week on average (not counting use with activity) Cough interfering with sleep two time or less a month Oral steroids no more than once a year No hospitalizations  Allergic rhinitis Continue Allegra once a day as needed for runny nose or itch. Remember to rotate to a different antihistamine about every 3-6 months between Allegra and Xyzal.  Continue Nasonex 2 sprays in each nostril once a day as needed for 1-2 weeks at a time for maximum benefit for congestion control.  Continue Azelastine 2 sprays in each nostril twice a day as needed for runny nose Continue Atrovent (ipratropium) nasal spray 2 sprays in each nostril up to 3 times a day as needed for runny nose In the right nostril, point the applicator out toward the right ear. In the left nostril, point the applicator out toward the left ear Continue allergen avoidance measures directed toward grass pollen, weed pollen, mold, dust mite, cat, and cockroach  Reflux Continue famotidine 20 mg twice a day to control reflux Can use Omeprazole 20 mg once a day to control reflux for about 6-8 week durations for increased reflux symptoms Continue dietary and lifestyle modifications as listed below   Follow up in 6  months or sooner if needed.

## 2022-06-05 LAB — CYTOLOGY - PAP
Comment: NEGATIVE
Diagnosis: NEGATIVE
High risk HPV: NEGATIVE

## 2022-06-06 ENCOUNTER — Other Ambulatory Visit: Payer: Self-pay | Admitting: Family Medicine

## 2022-06-06 ENCOUNTER — Other Ambulatory Visit (HOSPITAL_COMMUNITY): Payer: Self-pay

## 2022-06-06 MED ORDER — AZELASTINE HCL 137 MCG/SPRAY NA SOLN
2.0000 | Freq: Two times a day (BID) | NASAL | 5 refills | Status: DC | PRN
  Filled 2022-06-06: qty 30, 25d supply, fill #0
  Filled 2022-07-02: qty 30, 25d supply, fill #1
  Filled 2022-08-04: qty 30, 25d supply, fill #2
  Filled 2022-09-05: qty 30, 25d supply, fill #3
  Filled 2022-10-04: qty 30, 25d supply, fill #4
  Filled 2022-11-06: qty 30, 25d supply, fill #5

## 2022-06-06 MED ORDER — IPRATROPIUM BROMIDE 0.06 % NA SOLN
2.0000 | Freq: Three times a day (TID) | NASAL | 5 refills | Status: DC
  Filled 2022-06-06: qty 15, 14d supply, fill #0
  Filled 2022-07-02: qty 15, 14d supply, fill #1
  Filled 2022-08-04: qty 15, 14d supply, fill #2
  Filled 2022-09-05: qty 15, 14d supply, fill #3
  Filled 2022-10-04: qty 15, 14d supply, fill #4
  Filled 2022-11-06: qty 15, 14d supply, fill #5

## 2022-06-25 ENCOUNTER — Other Ambulatory Visit (HOSPITAL_COMMUNITY): Payer: Self-pay

## 2022-06-26 ENCOUNTER — Other Ambulatory Visit (HOSPITAL_COMMUNITY): Payer: Self-pay

## 2022-06-26 MED ORDER — NORGESTIM-ETH ESTRAD TRIPHASIC 0.18/0.215/0.25 MG-35 MCG PO TABS
1.0000 | ORAL_TABLET | Freq: Every day | ORAL | 3 refills | Status: AC
  Filled 2022-06-26: qty 84, 84d supply, fill #0
  Filled 2022-09-05: qty 84, 84d supply, fill #1

## 2022-07-03 ENCOUNTER — Other Ambulatory Visit (HOSPITAL_COMMUNITY): Payer: Self-pay

## 2022-07-08 ENCOUNTER — Other Ambulatory Visit: Payer: Self-pay | Admitting: Obstetrics and Gynecology

## 2022-07-08 DIAGNOSIS — Z8262 Family history of osteoporosis: Secondary | ICD-10-CM

## 2022-07-08 DIAGNOSIS — S92901S Unspecified fracture of right foot, sequela: Secondary | ICD-10-CM

## 2022-07-18 ENCOUNTER — Other Ambulatory Visit: Payer: Self-pay | Admitting: Obstetrics and Gynecology

## 2022-07-18 DIAGNOSIS — Z1231 Encounter for screening mammogram for malignant neoplasm of breast: Secondary | ICD-10-CM

## 2022-07-21 ENCOUNTER — Other Ambulatory Visit (HOSPITAL_COMMUNITY): Payer: Self-pay

## 2022-08-04 ENCOUNTER — Other Ambulatory Visit (HOSPITAL_COMMUNITY): Payer: Self-pay

## 2022-08-05 ENCOUNTER — Other Ambulatory Visit (HOSPITAL_COMMUNITY): Payer: Self-pay

## 2022-08-22 ENCOUNTER — Ambulatory Visit
Admission: RE | Admit: 2022-08-22 | Discharge: 2022-08-22 | Disposition: A | Discharge status: Home / Self Care | Payer: 59 | Source: Ambulatory Visit

## 2022-08-22 DIAGNOSIS — Z1231 Encounter for screening mammogram for malignant neoplasm of breast: Secondary | ICD-10-CM

## 2022-08-23 ENCOUNTER — Other Ambulatory Visit (HOSPITAL_COMMUNITY): Payer: Self-pay

## 2022-09-05 ENCOUNTER — Other Ambulatory Visit (HOSPITAL_COMMUNITY): Payer: Self-pay

## 2022-09-08 ENCOUNTER — Other Ambulatory Visit (HOSPITAL_COMMUNITY): Payer: Self-pay

## 2022-10-04 ENCOUNTER — Other Ambulatory Visit (HOSPITAL_COMMUNITY): Payer: Self-pay

## 2022-10-16 ENCOUNTER — Other Ambulatory Visit: Payer: Self-pay | Admitting: Allergy

## 2022-10-17 ENCOUNTER — Other Ambulatory Visit: Payer: Self-pay

## 2022-10-17 MED ORDER — MONTELUKAST SODIUM 10 MG PO TABS
10.0000 mg | ORAL_TABLET | Freq: Every day | ORAL | 0 refills | Status: DC
  Filled 2022-10-17: qty 90, 90d supply, fill #0

## 2022-10-19 ENCOUNTER — Ambulatory Visit (HOSPITAL_COMMUNITY)
Admission: EM | Admit: 2022-10-19 | Discharge: 2022-10-19 | Disposition: A | Discharge status: Home / Self Care | Payer: 59 | Attending: Emergency Medicine | Admitting: Emergency Medicine

## 2022-10-19 ENCOUNTER — Encounter (HOSPITAL_COMMUNITY): Payer: Self-pay

## 2022-10-19 DIAGNOSIS — J0100 Acute maxillary sinusitis, unspecified: Secondary | ICD-10-CM

## 2022-10-19 DIAGNOSIS — J4541 Moderate persistent asthma with (acute) exacerbation: Secondary | ICD-10-CM

## 2022-10-19 MED ORDER — MOXIFLOXACIN HCL 400 MG PO TABS: 400.0000 mg | ORAL_TABLET | Freq: Every day | ORAL | 0 refills | Status: DC

## 2022-10-19 MED ORDER — PREDNISONE 20 MG PO TABS: 40.0000 mg | ORAL_TABLET | Freq: Every day | ORAL | 0 refills | Status: DC

## 2022-10-19 MED ORDER — BENZONATATE 100 MG PO CAPS: 100.0000 mg | ORAL_CAPSULE | Freq: Three times a day (TID) | ORAL | 0 refills | Status: AC

## 2022-10-19 MED ORDER — PROMETHAZINE-DM 6.25-15 MG/5ML PO SYRP: 5.0000 mL | ORAL_SOLUTION | Freq: Four times a day (QID) | ORAL | 0 refills | Status: AC | PRN

## 2022-10-19 NOTE — ED Triage Notes (Signed)
Chief Complaint: congestion, hoarseness, head and sinus pain, chest congestion, cough that is productive at times, wheezing, fever of 101.2. No chills. 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

## 2022-10-19 NOTE — ED Provider Notes (Signed)
[facility]    CSN: [id number] Arrival date & time: 10/19/22  1040      History   Chief Complaint Chief Complaint  Patient presents with   URI   Facial Pain    HPI Regina Rice is a 56 y.o. female.   Patient presents with fever, chills, nasal congestion, bilateral ear pressure, rhinorrhea, sinus pain and pressure, dental pain, cough, shortness of breath and wheezing for 6 days.  Peaking at 101.2.  symptoms began to worsen 4 days ago.  Shortness of breath is experienced with exertion with associated chest tightness.  Cough is productive, sputum has changed from clear to green 1 day ago.  Facial pressure and pain is causing teeth and ears to hurt.  Has attempted use of albuterol inhaler, nebulizer, steam instillation and Mucinex with minimal relief.  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  History of asthma.  =  Past Medical History:  Diagnosis Date   Asthma    Migraines     Patient Active Problem List   Diagnosis Date Noted   Moderate persistent asthma 06/21/2020   Allergic conjunctivitis of both eyes 06/21/2020   Gastroesophageal reflux disease with esophagitis 06/21/2020   Gastroesophageal reflux disease 12/31/2018   Mild persistent asthma, uncomplicated 79/48/0165   Allergic rhinitis due to allergen 08/25/2016    Past Surgical History:  Procedure Laterality Date   ADENOIDECTOMY     DILATION AND CURETTAGE OF UTERUS     TONSILLECTOMY      OB History   No obstetric history on file.      Home Medications    Prior to Admission medications   Medication Sig Start Date End Date Taking? Authorizing Provider  albuterol (PROVENTIL) (2.5 MG/3ML) 0.083% nebulizer solution Inhale 1 vial (2.5 mg total) by nebulization every 4 (four) hours as needed for wheezing or shortness of breath. 12/05/21  Yes Padgett, Rae Halsted, MD  albuterol (VENTOLIN HFA) 108 (90 Base) MCG/ACT inhaler Inhale 2 puffs into the lungs every 4 (four) hours as  needed for wheezing or shortness of breath. 10/24/21  Yes Ambs, Kathrine Cords, FNP  aspirin 81 MG tablet Take 81 mg by mouth daily.   Yes [provider]  Azelastine HCl 137 MCG/SPRAY SOLN Place 2 sprays into both nostrils 2 (two) times daily as needed. 06/06/22  Yes Ambs, Kathrine Cords, FNP  brompheniramine-pseudoephedrine-DM 30-2-10 MG/5ML syrup Take 5 mLs by mouth 4 (four) times daily as needed. 01/19/22  Yes Gildardo Pounds, NP  budesonide-formoterol (SYMBICORT) 160-4.5 MCG/ACT inhaler Inhale 2 puffs into the lungs 2 times daily. 06/04/22 06/04/23 Yes Padgett, Rae Halsted, MD  Cholecalciferol (VITAMIN D3) 25 MCG (1000 UT) CAPS Take by mouth daily.   Yes [provider]  famotidine (PEPCID) 20 MG tablet Take 1 tablet (20 mg total) by mouth 2 (two) times daily. 10/24/21  Yes Ambs, Kathrine Cords, FNP  fexofenadine (ALLEGRA) 180 MG tablet Take 180 mg by mouth daily.   Yes [provider]  glucosamine-chondroitin 500-400 MG tablet Take 1 tablet by mouth 2 (two) times daily.   Yes [provider]  ipratropium (ATROVENT) 0.06 % nasal spray Place 2 sprays into both nostrils 3 (three) times daily. 06/06/22  Yes Ambs, Kathrine Cords, FNP  ipratropium-albuterol (DUONEB) 0.5-2.5 (3) MG/3ML SOLN Take 3 mLs by nebulization every 4 (four) hours as needed (shorntess of breath, wheezing and chest tightness). 04/22/21  Yes Padgett, Rae Halsted, MD  levocetirizine (XYZAL) 5 MG tablet Take 1 tablet (5  mg total) by mouth every evening. 04/22/21  Yes Padgett, Rae Halsted, MD  lisinopril (ZESTRIL) 10 MG tablet Take 1 tablet by mouth once a day for blood pressure 03/31/22  Yes   Melatonin Gummies 2.5 MG CHEW Chew 3 mg by mouth in the morning and at bedtime.   Yes [provider]  mometasone (NASONEX) 50 MCG/ACT nasal spray Place 2 sprays into the nose daily. 10/24/21  Yes Ambs, Kathrine Cords, FNP  montelukast (SINGULAIR) 10 MG tablet Take 1 tablet (10 mg total) by mouth at bedtime. 10/17/22  Yes Kennith Gain, MD  Multiple Vitamin (MULTI-VITAMIN PO) Take by mouth.   Yes [provider]  Norgestimate-Ethinyl Estradiol Triphasic (TRI-VYLIBRA) 0.18/0.215/0.25 MG-35 MCG tablet Take 1 tablet by mouth daily 06/26/22  Yes   rosuvastatin (CRESTOR) 10 MG tablet Take 1 tablet by mouth once a day for cholesterol 03/31/22  Yes   TURMERIC PO Take by mouth daily.   Yes [provider]  ZINC SULFATE PO Take 50 mg by mouth daily.   Yes [provider]  fluticasone (FLONASE) 50 MCG/ACT nasal spray Place 1 spray into both nostrils daily.    [provider]  lisinopril (ZESTRIL) 10 MG tablet TAKE 1 TABLET BY MOUTH ONCE DAILY FOR BLOOD PRESSURE Patient taking differently: Take by mouth daily. for blood pressure 02/17/20 02/16/21  Gaynelle Arabian, MD  predniSONE (DELTASONE) 10 MG tablet Take 2 tablets by mouth 2 times daily for 3 days, then 2 tablets on day 4, then 1 tablet on day 5, then stop. 01/30/22   Kennith Gain, MD  beclomethasone (QVAR) 80 MCG/ACT inhaler Inhale 2 puffs into the lungs 2 (two) times daily. 08/26/16 07/02/18  Kennith Gain, MD    Family History Family History  Problem Relation Age of Onset   Asthma Mother    Asthma Father    Allergic rhinitis Neg Hx    Angioedema Neg Hx    Eczema Neg Hx    Immunodeficiency Neg Hx    Urticaria Neg Hx    Breast cancer Neg Hx     Social History Social History   Tobacco Use   Smoking status: Never   Smokeless tobacco: Never  Vaping Use   Vaping Use: Never used  Substance Use Topics   Alcohol use: Yes    Comment: occasonally   Drug use: No     Allergies   Septra [sulfamethoxazole-trimethoprim] and Amoxicillin   Review of Systems Review of Systems  Constitutional:  Positive for chills and fever. Negative for activity change, appetite change, diaphoresis, fatigue and unexpected weight change.  HENT:  Positive for congestion, ear pain, rhinorrhea, sinus pressure and sinus pain.  Negative for dental problem, drooling, ear discharge, facial swelling, hearing loss, mouth sores, nosebleeds, postnasal drip, sneezing, sore throat, tinnitus, trouble swallowing and voice change.   Respiratory:  Positive for cough and wheezing. Negative for apnea, choking, chest tightness, shortness of breath and stridor.      Physical Exam Triage Vital Signs ED Triage Vitals  Enc Vitals Group     BP 10/19/22 1123 (!) 166/89     Pulse Rate 10/19/22 1123 82     Resp 10/19/22 1123 16     Temp 10/19/22 1123 98.8 F (37.1 C)     Temp Source 10/19/22 1123 Oral     SpO2 10/19/22 1123 96 %     Weight --      Height --      Head Circumference --  Peak Flow --      Pain Score 10/19/22 1121 8     Pain Loc --      Pain Edu? --      Excl. in [place]? --    No data found.  Updated Vital Signs BP (!) 166/89 (BP Location: Left Arm)   Pulse 82   Temp 98.8 F (37.1 C) (Oral)   Resp 16   SpO2 96%   Visual Acuity Right Eye Distance:   Left Eye Distance:   Bilateral Distance:    Right Eye Near:   Left Eye Near:    Bilateral Near:     Physical Exam Constitutional:      Appearance: Normal appearance.  HENT:     Head: Normocephalic.     Right Ear: Tympanic membrane, ear canal and external ear normal.     Left Ear: Tympanic membrane, ear canal and external ear normal.     Nose: Congestion and rhinorrhea present.     Right Sinus: Maxillary sinus tenderness present. No frontal sinus tenderness.     Left Sinus: Maxillary sinus tenderness present. No frontal sinus tenderness.     Mouth/Throat:     Mouth: Mucous membranes are moist.     Pharynx: No posterior oropharyngeal erythema.  Eyes:     Extraocular Movements: Extraocular movements intact.  Cardiovascular:     Rate and Rhythm: Normal rate and regular rhythm.     Pulses: Normal pulses.     Heart sounds: Normal heart sounds.  Pulmonary:     Effort: Pulmonary effort is normal.     Breath sounds: Normal breath sounds.   Musculoskeletal:        General: Normal range of motion.  Skin:    General: Skin is warm and dry.  Neurological:     Mental Status: She is alert and oriented to person, place, and time. Mental status is at baseline.  Psychiatric:        Mood and Affect: Mood normal.        Behavior: Behavior normal.      UC Treatments / Results  Labs (all labs ordered are listed, but only abnormal results are displayed) Labs Reviewed - No data to display  EKG   Radiology No results found.  Procedures Procedures (including critical care time)  Medications Ordered in UC Medications - No data to display  Initial Impression / Assessment and Plan / UC Course  I have reviewed the triage vital signs and the nursing notes.  Pertinent labs & imaging results that were available during my care of the patient were reviewed by me and considered in my medical decision making (see chart for details).  Acute nonrecurrent maxillary sinusitis, moderate persistent asthma with acute exacerbation  Vital signs are stable and while ill-appearing patient is in no signs of distress nor toxic, presentation is consistent with a sinusitis and flare of asthma as there is shortness of breath and wheezing attested to at home, due to timeline of illness will defer viral testing, prescribed moxifloxacin as patient endorses success with this medicine in the past, prednisone, Tessalon and Promethazine DM, may continue use of additional over-the-counter medications for supportive care with follow-up with urgent care as needed Final Clinical Impressions(s) / UC Diagnoses   Final diagnoses:  None     Discharge Instructions      Your symptoms today are most likely being caused by a virus and should steadily improve in time it can take up to 7 to 10 days  before you truly start to see a turnaround however things will get better  May begin use of azithromycin to provide coverage for bacteria  Begin prednisone every  morning with food for 5 days to help reduce inflammation and irritation to your airways should help minimize your wheezing.    You can take Tylenol and/or Ibuprofen as needed for fever reduction and pain relief.   For cough: honey 1/2 to 1 teaspoon (you can dilute the honey in water or another fluid).  You can also use guaifenesin and dextromethorphan for cough. You can use a humidifier for chest congestion and cough.  If you don't have a humidifier, you can sit in the bathroom with the hot shower running.      For sore throat: try warm salt water gargles, cepacol lozenges, throat spray, warm tea or water with lemon/honey, popsicles or ice, or OTC cold relief medicine for throat discomfort.   For congestion: take a daily anti-histamine like Zyrtec, Claritin, and a oral decongestant, such as pseudoephedrine.  You can also use Flonase 1-2 sprays in each nostril daily.   It is important to stay hydrated: drink plenty of fluids (water, gatorade/powerade/pedialyte, juices, or teas) to keep your throat moisturized and help further relieve irritation/discomfort.    ED Prescriptions   None    PDMP not reviewed this encounter.   Hans Eden, NP 10/19/22 1157

## 2022-10-19 NOTE — Discharge Instructions (Addendum)
Today you are being treated for a sinus infection and a flare of your asthma  On exam at this time your lungs are clear and you are getting enough air, able to hear wheezing when you are coughing  May begin use of azithromycin to provide coverage for bacteria  Begin prednisone every morning with food for 5 days to help reduce inflammation and irritation to your airways should help minimize your wheezing.  Begin use of Tessalon pill every 8 hours to help calm your coughing  May use cough syrup every 6 hours as needed for additional    You can take Tylenol and/or Ibuprofen as needed for fever reduction and pain relief.   For cough: honey 1/2 to 1 teaspoon (you can dilute the honey in water or another fluid).  You can also use guaifenesin and dextromethorphan for cough. You can use a humidifier for chest congestion and cough.  If you don't have a humidifier, you can sit in the bathroom with the hot shower running.      For sore throat: try warm salt water gargles, cepacol lozenges, throat spray, warm tea or water with lemon/honey, popsicles or ice, or OTC cold relief medicine for throat discomfort.   For congestion: take a daily anti-histamine like Zyrtec, Claritin, and a oral decongestant, such as pseudoephedrine.  You can also use Flonase 1-2 sprays in each nostril daily.   It is important to stay hydrated: drink plenty of fluids (water, gatorade/powerade/pedialyte, juices, or teas) to keep your throat moisturized and help further relieve irritation/discomfort.

## 2022-10-29 DIAGNOSIS — H40013 Open angle with borderline findings, low risk, bilateral: Secondary | ICD-10-CM | POA: Diagnosis not present

## 2022-11-07 ENCOUNTER — Other Ambulatory Visit (HOSPITAL_COMMUNITY): Payer: Self-pay

## 2022-11-21 ENCOUNTER — Other Ambulatory Visit: Payer: Self-pay

## 2022-12-05 ENCOUNTER — Other Ambulatory Visit: Payer: Self-pay

## 2022-12-05 ENCOUNTER — Other Ambulatory Visit (HOSPITAL_COMMUNITY): Payer: Self-pay

## 2022-12-05 ENCOUNTER — Ambulatory Visit: Payer: Commercial Managed Care - PPO | Admitting: Allergy

## 2022-12-05 ENCOUNTER — Encounter: Payer: Self-pay | Admitting: Allergy

## 2022-12-05 VITALS — BP 122/82 | HR 68 | Temp 98.1°F | Resp 16

## 2022-12-05 DIAGNOSIS — J4540 Moderate persistent asthma, uncomplicated: Secondary | ICD-10-CM | POA: Diagnosis not present

## 2022-12-05 DIAGNOSIS — K2100 Gastro-esophageal reflux disease with esophagitis, without bleeding: Secondary | ICD-10-CM | POA: Diagnosis not present

## 2022-12-05 DIAGNOSIS — J329 Chronic sinusitis, unspecified: Secondary | ICD-10-CM

## 2022-12-05 DIAGNOSIS — J3089 Other allergic rhinitis: Secondary | ICD-10-CM

## 2022-12-05 DIAGNOSIS — J302 Other seasonal allergic rhinitis: Secondary | ICD-10-CM

## 2022-12-05 MED ORDER — BUDESONIDE-FORMOTEROL FUMARATE 160-4.5 MCG/ACT IN AERO
2.0000 | INHALATION_SPRAY | Freq: Two times a day (BID) | RESPIRATORY_TRACT | 5 refills | Status: DC
  Filled 2022-12-05: qty 10.2, 30d supply, fill #0
  Filled 2023-01-03: qty 10.2, 30d supply, fill #1
  Filled 2023-02-07: qty 10.2, 30d supply, fill #2
  Filled 2023-03-08: qty 10.2, 30d supply, fill #3
  Filled 2023-04-13: qty 10.2, 30d supply, fill #4
  Filled 2023-05-10 – 2023-05-11 (×2): qty 10.2, 30d supply, fill #5

## 2022-12-05 NOTE — Addendum Note (Signed)
Addended by: Chip Boer R on: 12/05/2022 05:06 PM   Modules accepted: Orders

## 2022-12-05 NOTE — Patient Instructions (Signed)
Recurrent sinusitis You had a great response to pneumovax with a 100% protective rate.   Will keep track of infections moving forward  Asthma Continue montelukast 10 mg once a day to prevent cough or wheeze Continue Symbicort 160-2 puffs twice a day with a spacer to prevent cough or wheeze Continue albuterol 2 puffs every 4 hours as needed for cough or wheeze OR Instead use albuterol 0.083% solution via nebulizer one unit vial every 4 hours as needed for cough or wheeze For next week recommend using Mucinex DM 1227m twice a day with large glass of water to help thin and move out mucus from airway  Asthma control goals:  Full participation in all desired activities (may need albuterol before activity) Albuterol use two time or less a week on average (not counting use with activity) Cough interfering with sleep two time or less a month Oral steroids no more than once a year No hospitalizations  Allergic rhinitis Continue Allegra once a day as needed for runny nose or itch. Remember to rotate to a different antihistamine about every 3-6 months between Allegra and Xyzal.  Continue Nasonex 2 sprays in each nostril once a day as needed for 1-2 weeks at a time for maximum benefit for congestion control.  Continue Azelastine 2 sprays in each nostril twice a day as needed for runny nose Continue Atrovent (ipratropium) nasal spray 2 sprays in each nostril up to 3 times a day as needed for runny nose In the right nostril, point the applicator out toward the right ear. In the left nostril, point the applicator out toward the left ear Continue allergen avoidance measures directed toward grass pollen, weed pollen, mold, dust mite, cat, and cockroach  Reflux Continue Famotidine 20 mg twice a day to control reflux For next week add in Omeprazole 20 mg once a day to improve reflux Continue dietary and lifestyle modifications as listed below   Follow up in 6 months or sooner if needed.

## 2022-12-05 NOTE — Progress Notes (Signed)
Follow-up Note  RE: Regina Rice MRN: [id number] DOB: 09/29/1966 Date of Office Visit: 12/05/2022   History of present illness: Regina Rice is a 57 y.o. female presenting today for follow-up of asthma, allergic rhinitis, reflux and recurrent sinusitis.  She was last seen in the office on 06/04/22 by myself.    She was sick in December.  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  She states she had a URI with symptoms of coughing, sinus pressure, chest tightness, fever.  She went to UC.  She states she had negative swabs for covid, flu, rsv.    She did receive antibiotic (avelox), prednisone for 5 days,  tessalon perls and cough medication.  She added in using albuterol via nebulizer during this illness.  She states symptoms did improve.  However she is noting some ongoing hoarseness and still coughing and producing a milky white phlegm.   She continues her routine medications of Symbicort 174m 2 puffs twice a day, singulair daily, allegra as needed, using all nasal sprays: nasonex, azelastine, atrovent.  She also continues on pepcid but not currently on a PPI.     Review of systems: Review of Systems  Constitutional: Negative.   HENT:  Positive for voice change.   Eyes: Negative.   Respiratory:  Positive for cough.   Cardiovascular: Negative.   Gastrointestinal: Negative.   Musculoskeletal: Negative.   Skin: Negative.   Allergic/Immunologic: Negative.   Neurological: Negative.      All other systems negative unless noted above in HPI  Past medical/social/surgical/family history have been reviewed and are unchanged unless specifically indicated below.  No changes  Medication List: Current Outpatient Medications  Medication Sig Dispense Refill   albuterol (PROVENTIL) (2.5 MG/3ML) 0.083% nebulizer solution Inhale 1 vial (2.5 mg total) by nebulization every 4 (four) hours as needed for wheezing or shortness of breath. 150 mL 1    albuterol (VENTOLIN HFA) 108 (90 Base) MCG/ACT inhaler Inhale 2 puffs into the lungs every 4 (four) hours as needed for wheezing or shortness of breath. 18 g 1   aspirin 81 MG tablet Take 81 mg by mouth daily.     Azelastine HCl 137 MCG/SPRAY SOLN Place 2 sprays into both nostrils 2 (two) times daily as needed. 30 mL 5   budesonide-formoterol (SYMBICORT) 160-4.5 MCG/ACT inhaler Inhale 2 puffs into the lungs 2 times daily. 10.2 g 5   Cholecalciferol (VITAMIN D3) 25 MCG (1000 UT) CAPS Take by mouth daily.     famotidine (PEPCID) 20 MG tablet Take 1 tablet (20 mg total) by mouth 2 (two) times daily. 60 tablet 5   fexofenadine (ALLEGRA) 180 MG tablet Take 180 mg by mouth daily.     Glucos-Chond-Hyal Ac-Ca Fructo (MOVE FREE JOINT HEALTH ADVANCE) TABS      glucosamine-chondroitin 500-400 MG tablet Take 1 tablet by mouth 2 (two) times daily.     ipratropium (ATROVENT) 0.06 % nasal spray Place 2 sprays into both nostrils 3 (three) times daily. 15 mL 5   ipratropium-albuterol (DUONEB) 0.5-2.5 (3) MG/3ML SOLN Take 3 mLs by nebulization every 4 (four) hours as needed (shorntess of breath, wheezing and chest tightness). 90 mL 0   lisinopril (ZESTRIL) 10 MG tablet Take 1 tablet by mouth once a day for blood pressure 90 tablet 3   Melatonin Gummies 2.5 MG CHEW Chew 3 mg by mouth in the morning and at bedtime.     mometasone (NASONEX) 50 MCG/ACT  nasal spray Place 2 sprays into the nose daily. 17 g 5   montelukast (SINGULAIR) 10 MG tablet Take 1 tablet (10 mg total) by mouth at bedtime. 90 tablet 0   Multiple Vitamin (MULTI-VITAMIN PO) Take by mouth.     rosuvastatin (CRESTOR) 10 MG tablet Take 1 tablet by mouth once a day for cholesterol 90 tablet 3   TURMERIC PO Take by mouth daily.     ZINC SULFATE PO Take 50 mg by mouth daily.     benzonatate (TESSALON) 100 MG capsule Take 1 capsule (100 mg total) by mouth every 8 (eight) hours. (Patient not taking: Reported on 12/05/2022) 21 capsule 0    brompheniramine-pseudoephedrine-DM 30-2-10 MG/5ML syrup Take 5 mLs by mouth 4 (four) times daily as needed. (Patient not taking: Reported on 12/05/2022) 240 mL 0   fluticasone (FLONASE) 50 MCG/ACT nasal spray Place 1 spray into both nostrils daily.     levocetirizine (XYZAL) 5 MG tablet Take 1 tablet (5 mg total) by mouth every evening. (Patient not taking: Reported on 12/05/2022) 30 tablet 5   lisinopril (ZESTRIL) 10 MG tablet TAKE 1 TABLET BY MOUTH ONCE DAILY FOR BLOOD PRESSURE (Patient taking differently: Take by mouth daily. for blood pressure) 90 tablet 3   Norgestimate-Ethinyl Estradiol Triphasic (TRI-VYLIBRA) 0.18/0.215/0.25 MG-35 MCG tablet Take 1 tablet by mouth daily (Patient not taking: Reported on 12/05/2022) 84 tablet 3   promethazine-dextromethorphan (PROMETHAZINE-DM) 6.25-15 MG/5ML syrup Take 5 mLs by mouth 4 (four) times daily as needed for cough. (Patient not taking: Reported on 12/05/2022) 118 mL 0   No current facility-administered medications for this visit.     Known medication allergies: Allergies  Allergen Reactions   Amoxicillin Rash   Sulfamethoxazole-Trimethoprim Swelling     Physical examination: Blood pressure 122/82, pulse 68, temperature 98.1 F (36.7 C), temperature source Temporal, resp. rate 16, SpO2 96 %.  General: Alert, interactive, in no acute distress. HEENT: PERRLA, TMs pearly gray, turbinates minimally edematous without discharge, post-pharynx non erythematous. Neck: Supple without lymphadenopathy. Lungs: Clear to auscultation without wheezing, rhonchi or rales. {no increased work of breathing. CV: Normal S1, S2 without murmurs. Abdomen: Nondistended, nontender. Skin: Warm and dry, without lesions or rashes. Extremities:  No clubbing, cyanosis or edema. Neuro:   Grossly intact.  Diagnositics/Labs:  Spirometry: FEV1: 2.1L 63%, FVC: 2.24L 53% predicted.  Spirometry reduced from previous study and restrictive pattern  Assessment and plan: Recurrent  sinusitis You had a great response to pneumovax with a 100% protective rate.   Will keep track of infections moving forward  Asthma Continue montelukast 10 mg once a day to prevent cough or wheeze Continue Symbicort 160-2 puffs twice a day with a spacer to prevent cough or wheeze Continue albuterol 2 puffs every 4 hours as needed for cough or wheeze OR Instead use albuterol 0.083% solution via nebulizer one unit vial every 4 hours as needed for cough or wheeze For next week recommend using Mucinex DM 127m twice a day with large glass of water to help thin and move out mucus from airway  Asthma control goals:  Full participation in all desired activities (may need albuterol before activity) Albuterol use two time or less a week on average (not counting use with activity) Cough interfering with sleep two time or less a month Oral steroids no more than once a year No hospitalizations  Allergic rhinitis Continue Allegra once a day as needed for runny nose or itch. Remember to rotate to a different antihistamine about every 3-6  months between Allegra and Xyzal.  Continue Nasonex 2 sprays in each nostril once a day as needed for 1-2 weeks at a time for maximum benefit for congestion control.  Continue Azelastine 2 sprays in each nostril twice a day as needed for runny nose Continue Atrovent (ipratropium) nasal spray 2 sprays in each nostril up to 3 times a day as needed for runny nose In the right nostril, point the applicator out toward the right ear. In the left nostril, point the applicator out toward the left ear Continue allergen avoidance measures directed toward grass pollen, weed pollen, mold, dust mite, cat, and cockroach  Reflux Continue Famotidine 20 mg twice a day to control reflux For next week add in Omeprazole 20 mg once a day to improve reflux Continue dietary and lifestyle modifications as listed below   Follow up in 6 months or sooner if needed.  I appreciate the  opportunity to take part in Suanne's care. Please do not hesitate to contact me with questions.  Sincerely,   Prudy Feeler, MD Allergy/Immunology [facility]

## 2022-12-12 ENCOUNTER — Other Ambulatory Visit (HOSPITAL_COMMUNITY): Payer: Self-pay

## 2022-12-12 ENCOUNTER — Other Ambulatory Visit: Payer: Self-pay | Admitting: Family Medicine

## 2022-12-12 MED ORDER — AZELASTINE HCL 137 MCG/SPRAY NA SOLN
2.0000 | Freq: Two times a day (BID) | NASAL | 5 refills | Status: DC | PRN
  Filled 2022-12-12: qty 30, 25d supply, fill #0
  Filled 2023-01-03: qty 30, 25d supply, fill #1
  Filled 2023-02-07: qty 30, 25d supply, fill #2
  Filled 2023-03-08: qty 30, 25d supply, fill #3
  Filled 2023-04-13: qty 30, 25d supply, fill #4
  Filled 2023-05-10 – 2023-05-11 (×2): qty 30, 25d supply, fill #5

## 2022-12-12 MED ORDER — IPRATROPIUM BROMIDE 0.06 % NA SOLN
2.0000 | Freq: Three times a day (TID) | NASAL | 5 refills | Status: DC
  Filled 2022-12-12: qty 15, 25d supply, fill #0
  Filled 2023-01-15: qty 15, 25d supply, fill #1
  Filled 2023-02-07: qty 15, 25d supply, fill #2
  Filled 2023-04-13: qty 15, 25d supply, fill #3
  Filled 2023-05-10 – 2023-05-11 (×2): qty 15, 25d supply, fill #4
  Filled 2023-06-12: qty 15, 25d supply, fill #5

## 2022-12-15 ENCOUNTER — Other Ambulatory Visit: Payer: 59

## 2022-12-17 ENCOUNTER — Ambulatory Visit: Payer: Commercial Managed Care - PPO | Admitting: Podiatry

## 2022-12-17 ENCOUNTER — Encounter: Payer: Self-pay | Admitting: Podiatry

## 2022-12-17 DIAGNOSIS — M21619 Bunion of unspecified foot: Secondary | ICD-10-CM | POA: Diagnosis not present

## 2022-12-17 DIAGNOSIS — Q828 Other specified congenital malformations of skin: Secondary | ICD-10-CM | POA: Diagnosis not present

## 2022-12-17 DIAGNOSIS — M2041 Other hammer toe(s) (acquired), right foot: Secondary | ICD-10-CM

## 2022-12-17 NOTE — Progress Notes (Signed)
Subjective:   Patient ID: Regina Rice, female   DOB: 57 y.o.   MRN: [id number]   HPI Patient presents with significant foot structural issues with severe keratotic lesion subthird fourth metatarsal right with the third being worse with bunion and hammertoe deformity with family history.  Patient does not smoke moderate obesity tries to be active   Review of Systems  All other systems reviewed and are negative.       Objective:  Physical Exam Vitals and nursing note reviewed.  Constitutional:      Appearance: She is well-developed.  Pulmonary:     Effort: Pulmonary effort is normal.  Musculoskeletal:        General: Normal range of motion.  Skin:    General: Skin is warm.  Neurological:     Mental Status: She is alert.     Neurovascular status intact muscle strength found to be adequate range of motion adequate with significant keratotic lesion subthird fourth metatarsal right with the third being very tender when pressed with hammertoe deformity noted and structural bunion deformity noted right over left foot.  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  Also may require elevating osteotomy and today Sharp sterile debridement of lesion accomplished no angiogenic bleeding and we will see this back when sore again and make a decision what skin to be best for the long-term  X-rays indicate significant structural bunion deformity right over left with elevation of the intermetatarsal angle of a significant nature right over left and lesion directly on the third metatarsal head right

## 2023-01-03 ENCOUNTER — Other Ambulatory Visit (HOSPITAL_COMMUNITY): Payer: Self-pay

## 2023-01-15 ENCOUNTER — Other Ambulatory Visit: Payer: Self-pay | Admitting: Allergy

## 2023-01-16 ENCOUNTER — Other Ambulatory Visit: Payer: Self-pay

## 2023-01-16 MED ORDER — MONTELUKAST SODIUM 10 MG PO TABS
10.0000 mg | ORAL_TABLET | Freq: Every day | ORAL | 1 refills | Status: DC
  Filled 2023-01-16: qty 90, 90d supply, fill #0
  Filled 2023-04-13: qty 90, 90d supply, fill #1

## 2023-01-26 DIAGNOSIS — N915 Oligomenorrhea, unspecified: Secondary | ICD-10-CM | POA: Diagnosis not present

## 2023-02-09 ENCOUNTER — Other Ambulatory Visit (HOSPITAL_COMMUNITY): Payer: Self-pay

## 2023-02-09 ENCOUNTER — Other Ambulatory Visit: Payer: Self-pay

## 2023-02-15 ENCOUNTER — Other Ambulatory Visit (HOSPITAL_COMMUNITY): Payer: Self-pay

## 2023-02-20 ENCOUNTER — Other Ambulatory Visit (HOSPITAL_COMMUNITY): Payer: Self-pay

## 2023-02-20 DIAGNOSIS — I10 Essential (primary) hypertension: Secondary | ICD-10-CM | POA: Diagnosis not present

## 2023-02-20 DIAGNOSIS — E7800 Pure hypercholesterolemia, unspecified: Secondary | ICD-10-CM | POA: Diagnosis not present

## 2023-02-20 DIAGNOSIS — J45909 Unspecified asthma, uncomplicated: Secondary | ICD-10-CM | POA: Diagnosis not present

## 2023-02-20 DIAGNOSIS — Z0000 Encounter for general adult medical examination without abnormal findings: Secondary | ICD-10-CM | POA: Diagnosis not present

## 2023-02-20 DIAGNOSIS — R7303 Prediabetes: Secondary | ICD-10-CM | POA: Diagnosis not present

## 2023-02-20 MED ORDER — ROSUVASTATIN CALCIUM 10 MG PO TABS
10.0000 mg | ORAL_TABLET | Freq: Every day | ORAL | 4 refills | Status: DC
  Filled 2023-02-20 – 2023-05-11 (×3): qty 90, 90d supply, fill #0
  Filled 2023-08-28: qty 90, 90d supply, fill #1
  Filled 2023-11-13: qty 90, 90d supply, fill #2

## 2023-02-20 MED ORDER — LISINOPRIL 10 MG PO TABS
10.0000 mg | ORAL_TABLET | Freq: Every day | ORAL | 4 refills | Status: DC
  Filled 2023-02-20 – 2023-04-13 (×2): qty 90, 90d supply, fill #0
  Filled 2023-07-20: qty 90, 90d supply, fill #1
  Filled 2023-10-15: qty 90, 90d supply, fill #2
  Filled 2024-01-14: qty 90, 90d supply, fill #3

## 2023-03-09 ENCOUNTER — Other Ambulatory Visit: Payer: Self-pay

## 2023-04-13 ENCOUNTER — Other Ambulatory Visit (HOSPITAL_COMMUNITY): Payer: Self-pay

## 2023-04-14 ENCOUNTER — Other Ambulatory Visit: Payer: Self-pay

## 2023-04-14 ENCOUNTER — Encounter: Payer: Self-pay | Admitting: Pharmacist

## 2023-04-14 ENCOUNTER — Other Ambulatory Visit (HOSPITAL_COMMUNITY): Payer: Self-pay

## 2023-04-15 ENCOUNTER — Other Ambulatory Visit (HOSPITAL_COMMUNITY): Payer: Self-pay

## 2023-04-15 ENCOUNTER — Other Ambulatory Visit: Payer: Self-pay

## 2023-05-11 ENCOUNTER — Other Ambulatory Visit: Payer: Self-pay

## 2023-05-11 ENCOUNTER — Other Ambulatory Visit (HOSPITAL_COMMUNITY): Payer: Self-pay

## 2023-06-05 ENCOUNTER — Ambulatory Visit: Payer: Commercial Managed Care - PPO | Admitting: Allergy

## 2023-06-12 ENCOUNTER — Other Ambulatory Visit: Payer: Self-pay | Admitting: Allergy

## 2023-06-12 ENCOUNTER — Other Ambulatory Visit: Payer: Self-pay | Admitting: Family Medicine

## 2023-06-13 ENCOUNTER — Other Ambulatory Visit (HOSPITAL_COMMUNITY): Payer: Self-pay

## 2023-06-15 ENCOUNTER — Other Ambulatory Visit: Payer: Self-pay

## 2023-06-15 ENCOUNTER — Other Ambulatory Visit (HOSPITAL_COMMUNITY): Payer: Self-pay

## 2023-06-15 MED ORDER — AZELASTINE HCL 137 MCG/SPRAY NA SOLN
2.0000 | Freq: Two times a day (BID) | NASAL | 0 refills | Status: DC | PRN
  Filled 2023-06-15 – 2023-06-16 (×4): qty 30, 25d supply, fill #0

## 2023-06-16 ENCOUNTER — Other Ambulatory Visit: Payer: Self-pay

## 2023-06-16 ENCOUNTER — Other Ambulatory Visit (HOSPITAL_COMMUNITY): Payer: Self-pay

## 2023-06-17 ENCOUNTER — Other Ambulatory Visit: Payer: Self-pay

## 2023-06-17 ENCOUNTER — Other Ambulatory Visit (HOSPITAL_COMMUNITY): Payer: Self-pay

## 2023-06-17 ENCOUNTER — Telehealth: Payer: Self-pay | Admitting: Allergy

## 2023-06-17 MED ORDER — BUDESONIDE-FORMOTEROL FUMARATE 160-4.5 MCG/ACT IN AERO
2.0000 | INHALATION_SPRAY | Freq: Two times a day (BID) | RESPIRATORY_TRACT | 0 refills | Status: DC
  Filled 2023-06-17: qty 10.2, 30d supply, fill #0

## 2023-06-17 NOTE — Telephone Encounter (Signed)
Sent in curtesy refill to [facility]

## 2023-06-17 NOTE — Telephone Encounter (Signed)
Patient request a refill for symbicort she has an appt. She has an appt scheduled for 9/5.

## 2023-06-19 ENCOUNTER — Other Ambulatory Visit (HOSPITAL_COMMUNITY): Payer: Self-pay

## 2023-07-02 ENCOUNTER — Other Ambulatory Visit: Payer: Self-pay

## 2023-07-02 ENCOUNTER — Other Ambulatory Visit (HOSPITAL_COMMUNITY): Payer: Self-pay

## 2023-07-02 ENCOUNTER — Ambulatory Visit: Payer: Commercial Managed Care - PPO | Admitting: Allergy

## 2023-07-02 ENCOUNTER — Encounter: Payer: Self-pay | Admitting: Allergy

## 2023-07-02 VITALS — BP 122/78 | HR 62 | Temp 98.3°F | Ht 70.0 in | Wt 249.2 lb

## 2023-07-02 DIAGNOSIS — J329 Chronic sinusitis, unspecified: Secondary | ICD-10-CM

## 2023-07-02 DIAGNOSIS — J3089 Other allergic rhinitis: Secondary | ICD-10-CM | POA: Diagnosis not present

## 2023-07-02 DIAGNOSIS — J302 Other seasonal allergic rhinitis: Secondary | ICD-10-CM

## 2023-07-02 DIAGNOSIS — K2100 Gastro-esophageal reflux disease with esophagitis, without bleeding: Secondary | ICD-10-CM | POA: Diagnosis not present

## 2023-07-02 DIAGNOSIS — J4540 Moderate persistent asthma, uncomplicated: Secondary | ICD-10-CM

## 2023-07-02 MED ORDER — FAMOTIDINE 20 MG PO TABS
20.0000 mg | ORAL_TABLET | Freq: Two times a day (BID) | ORAL | 5 refills | Status: AC
  Filled 2023-07-02: qty 60, 30d supply, fill #0

## 2023-07-02 MED ORDER — IPRATROPIUM BROMIDE 0.06 % NA SOLN
2.0000 | Freq: Three times a day (TID) | NASAL | 5 refills | Status: DC
  Filled 2023-07-02 – 2023-07-03 (×2): qty 15, 25d supply, fill #0
  Filled 2023-08-01: qty 15, 25d supply, fill #1
  Filled 2023-08-28: qty 15, 25d supply, fill #2
  Filled 2023-10-02: qty 15, 25d supply, fill #3
  Filled 2023-11-13: qty 15, 25d supply, fill #4
  Filled 2024-01-08: qty 15, 25d supply, fill #5

## 2023-07-02 MED ORDER — ALBUTEROL SULFATE HFA 108 (90 BASE) MCG/ACT IN AERS
2.0000 | INHALATION_SPRAY | RESPIRATORY_TRACT | 1 refills | Status: AC | PRN
  Filled 2023-07-02: qty 6.7, 17d supply, fill #0

## 2023-07-02 MED ORDER — AZELASTINE HCL 137 MCG/SPRAY NA SOLN
2.0000 | Freq: Two times a day (BID) | NASAL | 5 refills | Status: DC | PRN
  Filled 2023-07-02 – 2023-07-06 (×3): qty 30, 25d supply, fill #0
  Filled 2023-08-28: qty 30, 25d supply, fill #1
  Filled 2023-10-02: qty 30, 25d supply, fill #2
  Filled 2023-11-13: qty 30, 25d supply, fill #3
  Filled 2024-01-08: qty 30, 25d supply, fill #4
  Filled 2024-02-23: qty 30, 25d supply, fill #5

## 2023-07-02 MED ORDER — MONTELUKAST SODIUM 10 MG PO TABS
10.0000 mg | ORAL_TABLET | Freq: Every day | ORAL | 1 refills | Status: DC
  Filled 2023-07-02: qty 90, 90d supply, fill #0
  Filled 2023-10-15: qty 90, 90d supply, fill #1

## 2023-07-02 MED ORDER — FEXOFENADINE HCL 180 MG PO TABS
180.0000 mg | ORAL_TABLET | Freq: Every day | ORAL | 5 refills | Status: AC
  Filled 2023-07-02: qty 30, 30d supply, fill #0

## 2023-07-02 MED ORDER — MOMETASONE FUROATE 50 MCG/ACT NA SUSP
2.0000 | Freq: Every day | NASAL | 5 refills | Status: AC
  Filled 2023-07-02: qty 17, 30d supply, fill #0

## 2023-07-02 MED ORDER — ALBUTEROL SULFATE (2.5 MG/3ML) 0.083% IN NEBU
2.5000 mg | INHALATION_SOLUTION | RESPIRATORY_TRACT | 1 refills | Status: AC | PRN
  Filled 2023-07-02: qty 180, 10d supply, fill #0

## 2023-07-02 NOTE — Progress Notes (Signed)
Follow-up Note  RE: Regina Rice MRN: [id number] DOB: 1966/10/07 Date of Office Visit: 07/02/2023   History of present illness: Treana Rice is a 57 y.o. female presenting today for follow-up of asthma, allergic rhinitis, reflux, recurrent sinusitis.  She was last seen in the office on 12/05/2022 by myself.  She has done well since this visit without any major health changes, surgeries or hospitalizations.  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  She has not had any infections or antibiotic needs since the last visit which is great. She states however she still has issues with nasal drainage mucus in the throat, throat clearing, coughing and chest congestion.  She is using the azelastine spray twice a day and the Atrovent spray in the morning.  The Atrovent spray is the last medication that she uses and she does feel a difference after using this as things are a bit more drier.  She is taking Allegra and does have Nasonex for use when she does have congestion.  She states she has not needed to use her rescue inhaler.  She does continue on Symbicort 2 puffs twice a day as well as Singulair daily.  She has not had any urgent care or ED visits or systemic steroid needs since the last visit.  For reflux control she does use famotidine.   Review of systems: 10pt ROS negative unless noted above in HPI  Past medical/social/surgical/family history have been reviewed and are unchanged unless specifically indicated below.  No changes  Medication List: Current Outpatient Medications  Medication Sig Dispense Refill   albuterol (PROVENTIL) (2.5 MG/3ML) 0.083% nebulizer solution Inhale 1 vial (2.5 mg total) by nebulization every 4 (four) hours as needed for wheezing or shortness of breath. 150 mL 1   albuterol (VENTOLIN HFA) 108 (90 Base) MCG/ACT inhaler Inhale 2 puffs into the lungs every 4 (four) hours as needed for wheezing or shortness of breath. 18 g 1   aspirin 81  MG tablet Take 81 mg by mouth daily.     Azelastine HCl 137 MCG/SPRAY SOLN Place 2 sprays into both nostrils 2 (two) times daily as needed. 30 mL 0   budesonide-formoterol (SYMBICORT) 160-4.5 MCG/ACT inhaler Inhale 2 puffs into the lungs 2 times daily. 10.2 g 0   Cholecalciferol (VITAMIN D3) 25 MCG (1000 UT) CAPS Take by mouth daily.     famotidine (PEPCID) 20 MG tablet Take 1 tablet (20 mg total) by mouth 2 (two) times daily. 60 tablet 5   fexofenadine (ALLEGRA) 180 MG tablet Take 180 mg by mouth daily.     Glucos-Chond-Hyal Ac-Ca Fructo (MOVE FREE JOINT HEALTH ADVANCE) TABS      glucosamine-chondroitin 500-400 MG tablet Take 1 tablet by mouth 2 (two) times daily.     ipratropium (ATROVENT) 0.06 % nasal spray Place 2 sprays into both nostrils 3 (three) times daily. 15 mL 5   ipratropium-albuterol (DUONEB) 0.5-2.5 (3) MG/3ML SOLN Take 3 mLs by nebulization every 4 (four) hours as needed (shorntess of breath, wheezing and chest tightness). 90 mL 0   lisinopril (ZESTRIL) 10 MG tablet Take 1 tablet (10 mg total) by mouth daily for blood pressure. 90 tablet 4   Melatonin Gummies 2.5 MG CHEW Chew 3 mg by mouth in the morning and at bedtime.     mometasone (NASONEX) 50 MCG/ACT nasal spray Place 2 sprays into the nose daily. 17 g 5   montelukast (SINGULAIR) 10 MG tablet Take 1  tablet (10 mg total) by mouth at bedtime. 90 tablet 1   Multiple Vitamin (MULTI-VITAMIN PO) Take by mouth.     rosuvastatin (CRESTOR) 10 MG tablet Take 1 tablet (10 mg total) by mouth daily for cholesterol. 90 tablet 4   TURMERIC PO Take by mouth daily.     Zinc 50 MG TABS 1 tablet Orally Once a day     ZINC SULFATE PO Take 50 mg by mouth daily.     benzonatate (TESSALON) 100 MG capsule Take 1 capsule (100 mg total) by mouth every 8 (eight) hours. (Patient not taking: Reported on 07/02/2023) 21 capsule 0   brompheniramine-pseudoephedrine-DM 30-2-10 MG/5ML syrup Take 5 mLs by mouth 4 (four) times daily as needed. (Patient not taking:  Reported on 07/02/2023) 240 mL 0   levocetirizine (XYZAL) 5 MG tablet Take 1 tablet (5 mg total) by mouth every evening. (Patient not taking: Reported on 07/02/2023) 30 tablet 5   Norgestimate-Ethinyl Estradiol Triphasic (TRI-VYLIBRA) 0.18/0.215/0.25 MG-35 MCG tablet Take 1 tablet by mouth daily (Patient not taking: Reported on 07/02/2023) 84 tablet 3   promethazine-dextromethorphan (PROMETHAZINE-DM) 6.25-15 MG/5ML syrup Take 5 mLs by mouth 4 (four) times daily as needed for cough. (Patient not taking: Reported on 07/02/2023) 118 mL 0   No current facility-administered medications for this visit.     Known medication allergies: Allergies  Allergen Reactions   Amoxicillin Rash   Sulfamethoxazole-Trimethoprim Swelling     Physical examination: Blood pressure 122/78, pulse 62, temperature 98.3 F (36.8 C), temperature source Temporal, height 5\' 10"  (1.778 m), weight 249 lb 3.2 oz (113 kg), SpO2 95%.  General: Alert, interactive, in no acute distress. HEENT: PERRLA, TMs pearly gray, turbinates non-edematous without discharge, post-pharynx non erythematous. Neck: Supple without lymphadenopathy. Lungs: Clear to auscultation without wheezing, rhonchi or rales. {no increased work of breathing. CV: Normal S1, S2 without murmurs. Abdomen: Nondistended, nontender. Skin: Warm and dry, without lesions or rashes. Extremities:  No clubbing, cyanosis or edema. Neuro:   Grossly intact.  Diagnositics/Labs:  Spirometry: FEV1: 3.33L 105%, FVC: 3.89L 96%, ratio consistent with nonobstructive pattern  Assessment and plan:   Recurrent sinusitis Doing well thus far this year without infection You had a great response to pneumovax with a 100% protective rate.   Will keep track of infections moving forward  Asthma Continue montelukast 10 mg once a day to prevent cough or wheeze Continue Symbicort 160-2 puffs twice a day with a spacer to prevent cough or wheeze Continue albuterol 2 puffs every 4 hours as  needed for cough or wheeze OR Instead use albuterol 0.083% solution via nebulizer one unit vial every 4 hours as needed for cough or wheeze  Asthma control goals:  Full participation in all desired activities (may need albuterol before activity) Albuterol use two time or less a week on average (not counting use with activity) Cough interfering with sleep two time or less a month Oral steroids no more than once a year No hospitalizations  Allergic rhinitis Continue Allegra once a day as needed for runny nose or itch. Remember to rotate to a different antihistamine about every 3-6 months between Allegra and Xyzal.  Continue Nasonex 2 sprays in each nostril once a day as needed for 1-2 weeks at a time for maximum benefit for congestion control.  Continue Azelastine 2 sprays in each nostril twice a day as needed for runny nose Continue Atrovent (ipratropium) nasal spray 2 sprays in each nostril up to 3 times a day as needed for runny  nose.  Advised to do a midday usage of the spray to help with the drainage. In the right nostril, point the applicator out toward the right ear. In the left nostril, point the applicator out toward the left ear Continue allergen avoidance measures directed toward grass pollen, weed pollen, mold, dust mite, cat, and cockroach  Reflux Continue Famotidine 20 mg twice a day to control reflux Continue dietary and lifestyle modifications as listed below  Follow up in 6 months or sooner if needed.  I appreciate the opportunity to take part in Lynnzie's care. Please do not hesitate to contact me with questions.  Sincerely,   Margo Aye, MD Allergy/Immunology [facility]

## 2023-07-02 NOTE — Patient Instructions (Addendum)
Recurrent sinusitis Doing well thus far this year without infection You had a great response to pneumovax with a 100% protective rate.   Will keep track of infections moving forward  Asthma Continue montelukast 10 mg once a day to prevent cough or wheeze Continue Symbicort 160-2 puffs twice a day with a spacer to prevent cough or wheeze Continue albuterol 2 puffs every 4 hours as needed for cough or wheeze OR Instead use albuterol 0.083% solution via nebulizer one unit vial every 4 hours as needed for cough or wheeze  Asthma control goals:  Full participation in all desired activities (may need albuterol before activity) Albuterol use two time or less a week on average (not counting use with activity) Cough interfering with sleep two time or less a month Oral steroids no more than once a year No hospitalizations  Allergic rhinitis Continue Allegra once a day as needed for runny nose or itch. Remember to rotate to a different antihistamine about every 3-6 months between Allegra and Xyzal.  Continue Nasonex 2 sprays in each nostril once a day as needed for 1-2 weeks at a time for maximum benefit for congestion control.  Continue Azelastine 2 sprays in each nostril twice a day as needed for runny nose Continue Atrovent (ipratropium) nasal spray 2 sprays in each nostril up to 3 times a day as needed for runny nose.  Advised to do a midday usage of the spray to help with the drainage. In the right nostril, point the applicator out toward the right ear. In the left nostril, point the applicator out toward the left ear Continue allergen avoidance measures directed toward grass pollen, weed pollen, mold, dust mite, cat, and cockroach  Reflux Continue Famotidine 20 mg twice a day to control reflux Continue dietary and lifestyle modifications as listed below   Follow up in 6 months or sooner if needed.

## 2023-07-03 ENCOUNTER — Other Ambulatory Visit (HOSPITAL_COMMUNITY): Payer: Self-pay

## 2023-07-03 ENCOUNTER — Other Ambulatory Visit: Payer: Self-pay | Admitting: Allergy

## 2023-07-03 MED ORDER — BUDESONIDE-FORMOTEROL FUMARATE 160-4.5 MCG/ACT IN AERO
2.0000 | INHALATION_SPRAY | Freq: Two times a day (BID) | RESPIRATORY_TRACT | 0 refills | Status: DC
  Filled 2023-07-03 – 2023-07-20 (×2): qty 10.2, 30d supply, fill #0

## 2023-07-06 ENCOUNTER — Other Ambulatory Visit (HOSPITAL_COMMUNITY): Payer: Self-pay

## 2023-07-07 ENCOUNTER — Other Ambulatory Visit (HOSPITAL_COMMUNITY): Payer: Self-pay

## 2023-07-20 ENCOUNTER — Other Ambulatory Visit (HOSPITAL_COMMUNITY): Payer: Self-pay

## 2023-08-28 ENCOUNTER — Other Ambulatory Visit: Payer: Self-pay | Admitting: Allergy

## 2023-08-28 ENCOUNTER — Other Ambulatory Visit (HOSPITAL_COMMUNITY): Payer: Self-pay

## 2023-08-28 ENCOUNTER — Other Ambulatory Visit: Payer: Self-pay

## 2023-08-28 MED ORDER — BUDESONIDE-FORMOTEROL FUMARATE 160-4.5 MCG/ACT IN AERO
2.0000 | INHALATION_SPRAY | Freq: Two times a day (BID) | RESPIRATORY_TRACT | 0 refills | Status: DC
  Filled 2023-08-28: qty 10.2, 30d supply, fill #0

## 2023-09-18 ENCOUNTER — Other Ambulatory Visit: Payer: Self-pay | Admitting: Obstetrics and Gynecology

## 2023-09-18 DIAGNOSIS — Z1231 Encounter for screening mammogram for malignant neoplasm of breast: Secondary | ICD-10-CM

## 2023-09-22 DIAGNOSIS — Z01419 Encounter for gynecological examination (general) (routine) without abnormal findings: Secondary | ICD-10-CM | POA: Diagnosis not present

## 2023-10-02 ENCOUNTER — Other Ambulatory Visit (HOSPITAL_COMMUNITY): Payer: Self-pay

## 2023-10-02 ENCOUNTER — Other Ambulatory Visit: Payer: Self-pay

## 2023-10-02 ENCOUNTER — Other Ambulatory Visit: Payer: Self-pay | Admitting: Allergy

## 2023-10-02 MED ORDER — BUDESONIDE-FORMOTEROL FUMARATE 160-4.5 MCG/ACT IN AERO
2.0000 | INHALATION_SPRAY | Freq: Two times a day (BID) | RESPIRATORY_TRACT | 0 refills | Status: DC
  Filled 2023-10-02: qty 10.2, 30d supply, fill #0

## 2023-10-15 ENCOUNTER — Other Ambulatory Visit (HOSPITAL_COMMUNITY): Payer: Self-pay

## 2023-11-06 ENCOUNTER — Ambulatory Visit: Payer: Self-pay

## 2023-11-09 ENCOUNTER — Other Ambulatory Visit: Payer: Self-pay

## 2023-11-12 DIAGNOSIS — H1045 Other chronic allergic conjunctivitis: Secondary | ICD-10-CM | POA: Diagnosis not present

## 2023-11-12 DIAGNOSIS — H40023 Open angle with borderline findings, high risk, bilateral: Secondary | ICD-10-CM | POA: Diagnosis not present

## 2023-11-13 ENCOUNTER — Other Ambulatory Visit: Payer: Self-pay

## 2023-11-13 ENCOUNTER — Other Ambulatory Visit (HOSPITAL_COMMUNITY): Payer: Self-pay

## 2023-11-13 ENCOUNTER — Other Ambulatory Visit: Payer: Self-pay | Admitting: Allergy

## 2023-11-13 MED ORDER — BUDESONIDE-FORMOTEROL FUMARATE 160-4.5 MCG/ACT IN AERO
2.0000 | INHALATION_SPRAY | Freq: Two times a day (BID) | RESPIRATORY_TRACT | 0 refills | Status: DC
  Filled 2023-11-13: qty 10.2, 30d supply, fill #0

## 2023-11-20 ENCOUNTER — Ambulatory Visit
Admission: RE | Admit: 2023-11-20 | Discharge: 2023-11-20 | Disposition: A | Discharge status: Home / Self Care | Payer: Commercial Managed Care - PPO | Source: Ambulatory Visit | Attending: Obstetrics and Gynecology | Admitting: Obstetrics and Gynecology

## 2023-11-20 ENCOUNTER — Ambulatory Visit: Payer: Self-pay

## 2023-11-20 DIAGNOSIS — Z1231 Encounter for screening mammogram for malignant neoplasm of breast: Secondary | ICD-10-CM | POA: Diagnosis not present

## 2024-01-08 ENCOUNTER — Other Ambulatory Visit: Payer: Self-pay

## 2024-01-08 ENCOUNTER — Other Ambulatory Visit (HOSPITAL_COMMUNITY): Payer: Self-pay

## 2024-01-08 ENCOUNTER — Other Ambulatory Visit: Payer: Self-pay | Admitting: Allergy

## 2024-01-08 MED ORDER — MONTELUKAST SODIUM 10 MG PO TABS
10.0000 mg | ORAL_TABLET | Freq: Every day | ORAL | 0 refills | Status: DC
  Filled 2024-01-08: qty 90, 90d supply, fill #0

## 2024-01-08 MED ORDER — BUDESONIDE-FORMOTEROL FUMARATE 160-4.5 MCG/ACT IN AERO
2.0000 | INHALATION_SPRAY | Freq: Two times a day (BID) | RESPIRATORY_TRACT | 0 refills | Status: DC
  Filled 2024-01-08: qty 10.2, 30d supply, fill #0

## 2024-01-14 ENCOUNTER — Ambulatory Visit: Payer: Commercial Managed Care - PPO | Admitting: Allergy

## 2024-01-14 ENCOUNTER — Other Ambulatory Visit: Payer: Self-pay

## 2024-01-14 ENCOUNTER — Encounter: Payer: Self-pay | Admitting: Allergy

## 2024-01-14 VITALS — BP 124/82 | HR 64 | Temp 98.1°F | Ht 70.0 in | Wt 258.6 lb

## 2024-01-14 DIAGNOSIS — J329 Chronic sinusitis, unspecified: Secondary | ICD-10-CM | POA: Diagnosis not present

## 2024-01-14 DIAGNOSIS — J302 Other seasonal allergic rhinitis: Secondary | ICD-10-CM

## 2024-01-14 DIAGNOSIS — K2100 Gastro-esophageal reflux disease with esophagitis, without bleeding: Secondary | ICD-10-CM

## 2024-01-14 DIAGNOSIS — J3089 Other allergic rhinitis: Secondary | ICD-10-CM

## 2024-01-14 DIAGNOSIS — J4540 Moderate persistent asthma, uncomplicated: Secondary | ICD-10-CM

## 2024-01-14 NOTE — Progress Notes (Signed)
 Follow-up Note  RE: Regina Rice MRN: [id number] DOB: 12-15-1965 Date of Office Visit: 01/14/2024   History of present illness: Regina Rice is a 58 y.o. female presenting today for follow-up of asthma, allergic rhinitis, reflux and recurrent sinusitis.  She was last seen in the office on 07/02/23 by myself.  Discussed the use of AI scribe software for clinical note transcription with the patient, who gave verbal consent to proceed.  History of Present Illness  She experiences significant morning symptoms related to mucus production. After lying down at night, she has a release of mucus upon waking and taking a shower, which she can then cough up and feel better. This pattern is chronic and not new for her.  She is currently using a regimen of nasal sprays to manage her symptoms. In the morning, she uses Nasonex first, followed by azelastine, and then Atrovent if needed. At night, she sometimes uses azelastine but does not typically use Nasonex or Atrovent.  She will use the Atrovent if the drainage is more bothersome during the day when she is trying to work.  Asthma is well-controlled with Symbicort and montelukast. She has used her rescue inhaler only a few times since September, mostly in the fall. She continues to use Symbicort and montelukast regularly.  She takes famotidine 20 mg twice daily for reflux, which she feels is somewhat controlled, though she occasionally experiences heartburn. On days with more symptoms, she uses Rolaids, which provides some relief. She also tries to avoid eating late and stays propped up at night to help manage her reflux symptoms.  Since her last visit in September, she has not experienced any sinus infections or required antibiotics. She has not had any respiratory illnesses such as COVID, flu, or RSV.   Review of systems: 10pt ROS negative unless noted above in HPI   Past medical/social/surgical/family history have been reviewed and are  unchanged unless specifically indicated below.  No changes  Medication List: Current Outpatient Medications  Medication Sig Dispense Refill   albuterol (PROVENTIL) (2.5 MG/3ML) 0.083% nebulizer solution Inhale 1 vial (2.5 mg total) by nebulization every 4 (four) hours as needed for wheezing or shortness of breath. 150 mL 1   albuterol (VENTOLIN HFA) 108 (90 Base) MCG/ACT inhaler Inhale 2 puffs into the lungs every 4 (four) hours as needed for wheezing or shortness of breath. 6.7 g 1   aspirin 81 MG tablet Take 81 mg by mouth daily.     Azelastine HCl 137 MCG/SPRAY SOLN Place 2 sprays into both nostrils 2 (two) times daily as needed. 30 mL 5   budesonide-formoterol (SYMBICORT) 160-4.5 MCG/ACT inhaler Inhale 2 puffs into the lungs 2 times daily. 10.2 g 0   Cholecalciferol (VITAMIN D3) 25 MCG (1000 UT) CAPS Take by mouth daily.     famotidine (PEPCID) 20 MG tablet Take 1 tablet (20 mg total) by mouth 2 (two) times daily. 60 tablet 5   fexofenadine (ALLEGRA) 180 MG tablet Take 1 tablet (180 mg total) by mouth daily. 30 tablet 5   Glucos-Chond-Hyal Ac-Ca Fructo (MOVE FREE JOINT HEALTH ADVANCE) TABS      glucosamine-chondroitin 500-400 MG tablet Take 1 tablet by mouth 2 (two) times daily.     ipratropium (ATROVENT) 0.06 % nasal spray Place 2 sprays into both nostrils 3 (three) times daily. 15 mL 5   ipratropium-albuterol (DUONEB) 0.5-2.5 (3) MG/3ML SOLN Take 3 mLs by nebulization every 4 (four) hours as needed (shorntess of breath, wheezing and chest  tightness). 90 mL 0   lisinopril (ZESTRIL) 10 MG tablet Take 1 tablet (10 mg total) by mouth daily for blood pressure. 90 tablet 4   Melatonin Gummies 2.5 MG CHEW Chew 3 mg by mouth in the morning and at bedtime.     mometasone (NASONEX) 50 MCG/ACT nasal spray Place 2 sprays into the nose daily. 17 g 5   montelukast (SINGULAIR) 10 MG tablet Take 1 tablet (10 mg total) by mouth at bedtime. 90 tablet 0   Multiple Vitamin (MULTI-VITAMIN PO) Take by mouth.      Norgestimate-Ethinyl Estradiol Triphasic (TRI-VYLIBRA) 0.18/0.215/0.25 MG-35 MCG tablet Take 1 tablet by mouth daily 84 tablet 3   rosuvastatin (CRESTOR) 10 MG tablet Take 1 tablet (10 mg total) by mouth daily for cholesterol. 90 tablet 4   TURMERIC PO Take by mouth daily.     Zinc 50 MG TABS 1 tablet Orally Once a day     ZINC SULFATE PO Take 50 mg by mouth daily.     benzonatate (TESSALON) 100 MG capsule Take 1 capsule (100 mg total) by mouth every 8 (eight) hours. (Patient not taking: Reported on 07/02/2023) 21 capsule 0   brompheniramine-pseudoephedrine-DM 30-2-10 MG/5ML syrup Take 5 mLs by mouth 4 (four) times daily as needed. (Patient not taking: Reported on 01/14/2024) 240 mL 0   levocetirizine (XYZAL) 5 MG tablet Take 1 tablet (5 mg total) by mouth every evening. (Patient not taking: Reported on 01/14/2024) 30 tablet 5   promethazine-dextromethorphan (PROMETHAZINE-DM) 6.25-15 MG/5ML syrup Take 5 mLs by mouth 4 (four) times daily as needed for cough. (Patient not taking: Reported on 01/14/2024) 118 mL 0   No current facility-administered medications for this visit.     Known medication allergies: Allergies  Allergen Reactions   Amoxicillin Rash   Sulfamethoxazole-Trimethoprim Swelling     Physical examination: Blood pressure 124/82, pulse 64, temperature 98.1 F (36.7 C), temperature source Temporal, height 5\' 10"  (1.778 m), weight 258 lb 9.6 oz (117.3 kg), SpO2 96%.  General: Alert, interactive, in no acute distress. HEENT: PERRLA, TMs pearly gray, turbinates minimally edematous without discharge, post-pharynx non erythematous. Neck: Supple without lymphadenopathy. Lungs: Clear to auscultation without wheezing, rhonchi or rales. {no increased work of breathing. CV: Normal S1, S2 without murmurs.   Abdomen: Nondistended, nontender. Skin: Warm and dry, without lesions or rashes. Extremities:  No clubbing, cyanosis or edema. Neuro:   Grossly  intact.  Diagnositics/Labs:  Spirometry: FEV1: 3.24L 103%, FVC: 3.9L 97%, ratio consistent with pattern  Assessment and plan:   Recurrent sinusitis Doing well thus far this year without infection or antibiotic needs You had a great response to pneumovax with a 100% protective rate.   Will continue to keep track of infections moving forward  Asthma Continue montelukast 10 mg once a day to prevent cough or wheeze Continue Symbicort 160-2 puffs twice a day with a spacer to prevent cough or wheeze Continue albuterol 2 puffs every 4 hours as needed for cough or wheeze OR Instead use albuterol 0.083% solution via nebulizer one unit vial every 4 hours as needed for cough or wheeze  Asthma control goals:  Full participation in all desired activities (may need albuterol before activity) Albuterol use two time or less a week on average (not counting use with activity) Cough interfering with sleep two time or less a month Oral steroids no more than once a year No hospitalizations  Allergic rhinitis Continue Allegra once a day as needed for runny nose or itch. Remember to  rotate to a different antihistamine about every 3-6 months between Allegra and Xyzal.  Continue Nasonex 2 sprays in each nostril once a day as needed for 1-2 weeks at a time for maximum benefit for congestion control.  Continue Azelastine 2 sprays in each nostril in the morning and at night before bed for drainage control Continue Atrovent (ipratropium) nasal spray 2 sprays in each nostril up to 3 times a day as needed for runny nose.  Advised to do a midday usage of the spray to help with the drainage. In the right nostril, point the applicator out toward the right ear. In the left nostril, point the applicator out toward the left ear Continue allergen avoidance measures directed toward grass pollen, weed pollen, mold, dust mite, cat, and cockroach  Reflux Continue Famotidine 20 mg twice a day to control reflux.   If  famotidine becomes ineffective then would change for about a 6 to 8-week duration of a PPI like Nexium, Prilosec. continue dietary and lifestyle modifications as listed below  Follow up in 6 months or sooner if needed.   I appreciate the opportunity to take part in Vineta's care. Please do not hesitate to contact me with questions.  Sincerely,   Margo Aye, MD Allergy/Immunology [facility]

## 2024-01-14 NOTE — Patient Instructions (Addendum)
 Recurrent sinusitis Doing well thus far this year without infection or antibiotic needs You had a great response to pneumovax with a 100% protective rate.   Will continue to keep track of infections moving forward  Asthma Continue montelukast 10 mg once a day to prevent cough or wheeze Continue Symbicort 160-2 puffs twice a day with a spacer to prevent cough or wheeze Continue albuterol 2 puffs every 4 hours as needed for cough or wheeze OR Instead use albuterol 0.083% solution via nebulizer one unit vial every 4 hours as needed for cough or wheeze  Asthma control goals:  Full participation in all desired activities (may need albuterol before activity) Albuterol use two time or less a week on average (not counting use with activity) Cough interfering with sleep two time or less a month Oral steroids no more than once a year No hospitalizations  Allergic rhinitis Continue Allegra once a day as needed for runny nose or itch. Remember to rotate to a different antihistamine about every 3-6 months between Allegra and Xyzal.  Continue Nasonex 2 sprays in each nostril once a day as needed for 1-2 weeks at a time for maximum benefit for congestion control.  Continue Azelastine 2 sprays in each nostril in the morning and at night before bed for drainage control Continue Atrovent (ipratropium) nasal spray 2 sprays in each nostril up to 3 times a day as needed for runny nose.  Advised to do a midday usage of the spray to help with the drainage. In the right nostril, point the applicator out toward the right ear. In the left nostril, point the applicator out toward the left ear Continue allergen avoidance measures directed toward grass pollen, weed pollen, mold, dust mite, cat, and cockroach  Reflux Continue Famotidine 20 mg twice a day to control reflux.   If famotidine becomes ineffective then would change for about a 6 to 8-week duration of a PPI like Nexium, Prilosec. continue dietary and  lifestyle modifications as listed below   Follow up in 6 months or sooner if needed.

## 2024-02-05 ENCOUNTER — Other Ambulatory Visit: Payer: Self-pay | Admitting: Allergy

## 2024-02-05 ENCOUNTER — Other Ambulatory Visit (HOSPITAL_COMMUNITY): Payer: Self-pay

## 2024-02-05 MED ORDER — BUDESONIDE-FORMOTEROL FUMARATE 160-4.5 MCG/ACT IN AERO
2.0000 | INHALATION_SPRAY | Freq: Two times a day (BID) | RESPIRATORY_TRACT | 4 refills | Status: DC
  Filled 2024-02-05: qty 10.2, 30d supply, fill #0
  Filled 2024-03-29: qty 10.2, 30d supply, fill #1
  Filled 2024-05-15: qty 10.2, 30d supply, fill #2
  Filled 2024-07-01: qty 10.2, 30d supply, fill #3

## 2024-02-05 MED ORDER — IPRATROPIUM BROMIDE 0.06 % NA SOLN
2.0000 | Freq: Three times a day (TID) | NASAL | 5 refills | Status: DC
  Filled 2024-02-05: qty 15, 25d supply, fill #0
  Filled 2024-02-25: qty 15, 25d supply, fill #1
  Filled 2024-03-29: qty 15, 25d supply, fill #2
  Filled 2024-04-21: qty 15, 25d supply, fill #3
  Filled 2024-05-15: qty 15, 25d supply, fill #4
  Filled 2024-07-01: qty 15, 25d supply, fill #5

## 2024-02-05 NOTE — Telephone Encounter (Signed)
 Refill for Symbicort 160 mcg x 1 with 4 refills at [facility].

## 2024-02-23 ENCOUNTER — Other Ambulatory Visit (HOSPITAL_COMMUNITY): Payer: Self-pay

## 2024-02-23 ENCOUNTER — Other Ambulatory Visit: Payer: Self-pay

## 2024-02-23 DIAGNOSIS — R7303 Prediabetes: Secondary | ICD-10-CM | POA: Diagnosis not present

## 2024-02-23 DIAGNOSIS — I10 Essential (primary) hypertension: Secondary | ICD-10-CM | POA: Diagnosis not present

## 2024-02-23 DIAGNOSIS — Z0000 Encounter for general adult medical examination without abnormal findings: Secondary | ICD-10-CM | POA: Diagnosis not present

## 2024-02-23 DIAGNOSIS — E7800 Pure hypercholesterolemia, unspecified: Secondary | ICD-10-CM | POA: Diagnosis not present

## 2024-02-23 MED ORDER — ROSUVASTATIN CALCIUM 10 MG PO TABS
10.0000 mg | ORAL_TABLET | Freq: Every day | ORAL | 4 refills | Status: AC
  Filled 2024-02-23: qty 90, 90d supply, fill #0
  Filled 2024-05-30: qty 90, 90d supply, fill #1
  Filled 2024-08-30: qty 90, 90d supply, fill #2
  Filled 2024-11-21: qty 90, 90d supply, fill #3

## 2024-02-23 MED ORDER — LISINOPRIL 10 MG PO TABS
10.0000 mg | ORAL_TABLET | Freq: Every day | ORAL | 4 refills | Status: AC
  Filled 2024-02-23 – 2024-04-21 (×2): qty 90, 90d supply, fill #0
  Filled 2024-07-20: qty 90, 90d supply, fill #1
  Filled 2024-10-17: qty 90, 90d supply, fill #2

## 2024-03-29 ENCOUNTER — Other Ambulatory Visit: Payer: Self-pay

## 2024-03-29 ENCOUNTER — Other Ambulatory Visit: Payer: Self-pay | Admitting: Allergy

## 2024-03-29 ENCOUNTER — Other Ambulatory Visit (HOSPITAL_COMMUNITY): Payer: Self-pay

## 2024-03-29 MED ORDER — AZELASTINE HCL 137 MCG/SPRAY NA SOLN
2.0000 | Freq: Two times a day (BID) | NASAL | 5 refills | Status: DC | PRN
  Filled 2024-03-29: qty 30, 25d supply, fill #0
  Filled 2024-05-15: qty 30, 25d supply, fill #1
  Filled 2024-07-01: qty 30, 25d supply, fill #2

## 2024-04-01 ENCOUNTER — Other Ambulatory Visit (HOSPITAL_COMMUNITY): Payer: Self-pay

## 2024-04-01 MED ORDER — PEG 3350-KCL-NA BICARB-NACL 420 G PO SOLR
ORAL | 0 refills | Status: AC
  Filled 2024-04-01: qty 4000, 1d supply, fill #0

## 2024-04-01 MED ORDER — BISACODYL 5 MG PO TBEC
DELAYED_RELEASE_TABLET | ORAL | 0 refills | Status: AC
  Filled 2024-04-01: qty 4, 1d supply, fill #0

## 2024-04-04 ENCOUNTER — Other Ambulatory Visit (HOSPITAL_COMMUNITY): Payer: Self-pay

## 2024-04-21 ENCOUNTER — Other Ambulatory Visit: Payer: Self-pay

## 2024-04-21 ENCOUNTER — Other Ambulatory Visit (HOSPITAL_COMMUNITY): Payer: Self-pay

## 2024-04-21 ENCOUNTER — Other Ambulatory Visit: Payer: Self-pay | Admitting: Allergy

## 2024-04-21 MED ORDER — MONTELUKAST SODIUM 10 MG PO TABS
10.0000 mg | ORAL_TABLET | Freq: Every day | ORAL | 1 refills | Status: DC
  Filled 2024-04-21: qty 90, 90d supply, fill #0
  Filled 2024-07-20: qty 90, 90d supply, fill #1

## 2024-04-25 DIAGNOSIS — K5730 Diverticulosis of large intestine without perforation or abscess without bleeding: Secondary | ICD-10-CM | POA: Diagnosis not present

## 2024-04-25 DIAGNOSIS — Z09 Encounter for follow-up examination after completed treatment for conditions other than malignant neoplasm: Secondary | ICD-10-CM | POA: Diagnosis not present

## 2024-04-25 DIAGNOSIS — Z860100 Personal history of colon polyps, unspecified: Secondary | ICD-10-CM | POA: Diagnosis not present

## 2024-05-16 ENCOUNTER — Other Ambulatory Visit (HOSPITAL_COMMUNITY): Payer: Self-pay

## 2024-06-13 ENCOUNTER — Other Ambulatory Visit (HOSPITAL_COMMUNITY): Payer: Self-pay

## 2024-06-13 ENCOUNTER — Ambulatory Visit
Admission: EM | Admit: 2024-06-13 | Discharge: 2024-06-13 | Disposition: A | Discharge status: Home / Self Care | Attending: Physician Assistant | Admitting: Physician Assistant

## 2024-06-13 ENCOUNTER — Ambulatory Visit: Payer: Self-pay

## 2024-06-13 ENCOUNTER — Other Ambulatory Visit: Payer: Self-pay

## 2024-06-13 DIAGNOSIS — R42 Dizziness and giddiness: Secondary | ICD-10-CM | POA: Diagnosis not present

## 2024-06-13 DIAGNOSIS — R112 Nausea with vomiting, unspecified: Secondary | ICD-10-CM

## 2024-06-13 LAB — GLUCOSE, POCT (MANUAL RESULT ENTRY): POC Glucose: 140 mg/dL — AB (ref 70–99)

## 2024-06-13 MED ORDER — ONDANSETRON 4 MG PO TBDP
4.0000 mg | ORAL_TABLET | Freq: Three times a day (TID) | ORAL | 0 refills | Status: AC | PRN
  Filled 2024-06-13: qty 20, 7d supply, fill #0

## 2024-06-13 MED ORDER — ONDANSETRON 4 MG PO TBDP
4.0000 mg | ORAL_TABLET | Freq: Once | ORAL | Status: AC
  Administered 2024-06-13: 4 mg via ORAL

## 2024-06-13 MED ORDER — SCOPOLAMINE 1 MG/3DAYS TD PT72
1.0000 | MEDICATED_PATCH | TRANSDERMAL | 0 refills | Status: AC
  Filled 2024-06-13: qty 4, 12d supply, fill #0

## 2024-06-13 NOTE — ED Triage Notes (Signed)
 Pt presents with a chief complaint of dizziness. Began this morning at 0610 when bending down for dog bowl. Dizziness is accompanied with vomiting, sweating, and a elevated BP. BP was 138/90 and pulse was 55 at 0750. At-home COVID tests were negative at home today. Pt states the tests were expired. Currently denies pain, no nausea at this moment while sitting in chair.

## 2024-06-13 NOTE — ED Provider Notes (Signed)
 [facility]    CSN: [id number] Arrival date & time: 06/13/24  9146      History   Chief Complaint Chief Complaint  Patient presents with   Dizziness    Began this morning at 0610. 138/90, Pulse 55, sweating.    Emesis    HPI Regina Rice is a 58 y.o. female.   HPI  Pt states she has concerns for dizziness and associated nausea with vomiting She reports feeling unsteady while moving but if she is not moving she feels like the room is spinning and she feels like she is not able to focus well She reports that she felt hot and started to sweat which made her feel like her heart was racing  Her companion states he took her BP while this was happening and it was 138/90  She denies sore throat but states her throat and mouth feel dry  She reports that moving her head makes her dizziness feel worse    Past Medical History:  Diagnosis Date   Asthma    Migraines     Patient Active Problem List   Diagnosis Date Noted   Moderate persistent asthma 06/21/2020   Allergic conjunctivitis of both eyes 06/21/2020   Gastroesophageal reflux disease with esophagitis 06/21/2020   Gastroesophageal reflux disease 12/31/2018   Mild persistent asthma, uncomplicated 08/25/2016   Allergic rhinitis due to allergen 08/25/2016    Past Surgical History:  Procedure Laterality Date   ADENOIDECTOMY     DILATION AND CURETTAGE OF UTERUS     TONSILLECTOMY      OB History   No obstetric history on file.      Home Medications    Prior to Admission medications   Medication Sig Start Date End Date Taking? Authorizing Provider  ondansetron  (ZOFRAN -ODT) 4 MG disintegrating tablet Take 1 tablet (4 mg total) by mouth every 8 (eight) hours as needed for nausea or vomiting. 06/13/24  Yes Corry Ihnen E, PA-C  scopolamine  (TRANSDERM-SCOP) 1 MG/3DAYS Place 1 patch (1.5 mg total) onto the skin every 3 (three) days. 06/13/24  Yes Damaris Abeln E, PA-C  albuterol  (PROVENTIL ) (2.5 MG/3ML)  0.083% nebulizer solution Inhale 1 vial (2.5 mg total) by nebulization every 4 (four) hours as needed for wheezing or shortness of breath. 07/02/23   Jeneal Danita Macintosh, MD  albuterol  (VENTOLIN  HFA) 108 (90 Base) MCG/ACT inhaler Inhale 2 puffs into the lungs every 4 (four) hours as needed for wheezing or shortness of breath. 07/02/23   Jeneal Danita Macintosh, MD  aspirin 81 MG tablet Take 81 mg by mouth daily.    [provider]  Azelastine  HCl 137 MCG/SPRAY SOLN Place 2 sprays into both nostrils 2 (two) times daily as needed. 03/29/24   Jeneal Danita Macintosh, MD  benzonatate  (TESSALON ) 100 MG capsule Take 1 capsule (100 mg total) by mouth every 8 (eight) hours. Patient not taking: Reported on 07/02/2023 10/19/22   Teresa Shelba SAUNDERS, NP  bisacodyl  (DULCOLAX) 5 MG EC tablet Take by mouth as directed 02/10/24   Rosalie Kitchens, MD  brompheniramine-pseudoephedrine-DM 30-2-10 MG/5ML syrup Take 5 mLs by mouth 4 (four) times daily as needed. Patient not taking: Reported on 01/14/2024 01/19/22   Fleming, Zelda W, NP  budesonide -formoterol  (SYMBICORT ) 160-4.5 MCG/ACT inhaler Inhale 2 puffs into the lungs 2 times daily. 02/05/24 02/04/25  Jeneal Danita Macintosh, MD  Cholecalciferol (VITAMIN D3) 25 MCG (1000 UT) CAPS Take by mouth daily.    [provider]  famotidine  (PEPCID ) 20 MG tablet  Take 1 tablet (20 mg total) by mouth 2 (two) times daily. 07/02/23   Jeneal Danita Macintosh, MD  fexofenadine  (ALLEGRA ) 180 MG tablet Take 1 tablet (180 mg total) by mouth daily. 07/02/23   Jeneal Danita Macintosh, MD  Glucos-Chond-Hyal Ac-Ca Fructo (MOVE FREE JOINT HEALTH ADVANCE) TABS  04/26/18   [provider]  glucosamine-chondroitin 500-400 MG tablet Take 1 tablet by mouth 2 (two) times daily.    [provider]  ipratropium (ATROVENT ) 0.06 % nasal spray Place 2 sprays into both nostrils 3 (three) times daily. 02/05/24   Jeneal Danita Macintosh, MD  ipratropium-albuterol  (DUONEB)  0.5-2.5 (3) MG/3ML SOLN Take 3 mLs by nebulization every 4 (four) hours as needed (shorntess of breath, wheezing and chest tightness). 04/22/21   Jeneal Danita Macintosh, MD  levocetirizine (XYZAL ) 5 MG tablet Take 1 tablet (5 mg total) by mouth every evening. Patient not taking: Reported on 01/14/2024 04/22/21   Jeneal Danita Macintosh, MD  lisinopril  (ZESTRIL ) 10 MG tablet Take 1 tablet (10 mg total) by mouth daily. 02/23/24     Melatonin Gummies 2.5 MG CHEW Chew 3 mg by mouth in the morning and at bedtime.    [provider]  mometasone  (NASONEX ) 50 MCG/ACT nasal spray Place 2 sprays into the nose daily. 07/02/23   Jeneal Danita Macintosh, MD  montelukast  (SINGULAIR ) 10 MG tablet Take 1 tablet (10 mg total) by mouth at bedtime. 04/21/24   Jeneal Danita Macintosh, MD  Multiple Vitamin (MULTI-VITAMIN PO) Take by mouth.    [provider]  Norgestimate -Ethinyl Estradiol Triphasic (TRI-VYLIBRA ) 0.18/0.215/0.25 MG-35 MCG tablet Take 1 tablet by mouth daily 06/26/22     polyethylene glycol-electrolytes (NULYTELY) 420 g solution take as directed 02/10/24   Rosalie Kitchens, MD  promethazine -dextromethorphan (PROMETHAZINE -DM) 6.25-15 MG/5ML syrup Take 5 mLs by mouth 4 (four) times daily as needed for cough. Patient not taking: Reported on 01/14/2024 10/19/22   Teresa Shelba SAUNDERS, NP  rosuvastatin  (CRESTOR ) 10 MG tablet Take 1 tablet (10 mg total) by mouth daily. 02/23/24     TURMERIC PO Take by mouth daily.    [provider]  Zinc 50 MG TABS 1 tablet Orally Once a day    [provider]  ZINC SULFATE PO Take 50 mg by mouth daily.    [provider]  beclomethasone (QVAR ) 80 MCG/ACT inhaler Inhale 2 puffs into the lungs 2 (two) times daily. 08/26/16 07/02/18  Jeneal Danita Macintosh, MD    Family History Family History  Problem Relation Age of Onset   Asthma Mother    Asthma Father    Allergic rhinitis Neg Hx    Angioedema Neg Hx    Eczema Neg Hx     Immunodeficiency Neg Hx    Urticaria Neg Hx    Breast cancer Neg Hx     Social History Social History   Tobacco Use   Smoking status: Never   Smokeless tobacco: Never  Vaping Use   Vaping status: Never Used  Substance Use Topics   Alcohol use: Yes    Comment: occasonally   Drug use: No     Allergies   Amoxicillin and Sulfamethoxazole-trimethoprim   Review of Systems Review of Systems  Constitutional:  Positive for diaphoresis. Negative for chills and fever.  HENT:  Negative for ear pain and sore throat.   Respiratory:  Negative for cough and shortness of breath.   Cardiovascular:  Negative for chest pain and palpitations.  Gastrointestinal:  Positive for nausea and vomiting.  Neurological:  Positive for dizziness. Negative for headaches.     Physical Exam Triage Vital Signs ED Triage Vitals  Encounter Vitals Group     BP 06/13/24 0908 113/75     Girls Systolic BP Percentile --      Girls Diastolic BP Percentile --      Boys Systolic BP Percentile --      Boys Diastolic BP Percentile --      Pulse Rate 06/13/24 0908 (!) 55     Resp 06/13/24 0908 17     Temp 06/13/24 0908 (!) 97.5 F (36.4 C)     Temp Source 06/13/24 0908 Oral     SpO2 06/13/24 0908 98 %     Weight 06/13/24 0910 258 lb 9.6 oz (117.3 kg)     Height 06/13/24 0910 5' 10 (1.778 m)     Head Circumference --      Peak Flow --      Pain Score 06/13/24 0910 0     Pain Loc --      Pain Education --      Exclude from Growth Chart --    No data found.  Updated Vital Signs BP 113/75 (BP Location: Right Arm) Comment: pt states she has not taken her BP medication today.  Pulse (!) 55   Temp (!) 97.5 F (36.4 C) (Oral)   Resp 17   Ht 5' 10 (1.778 m)   Wt 258 lb 9.6 oz (117.3 kg)   SpO2 98%   BMI 37.11 kg/m   Visual Acuity Right Eye Distance:   Left Eye Distance:   Bilateral Distance:    Right Eye Near:   Left Eye Near:    Bilateral Near:     Physical Exam Vitals reviewed.   Constitutional:      General: She is awake. She is not in acute distress.    Appearance: Normal appearance. She is well-developed and well-groomed. She is not ill-appearing or toxic-appearing.  HENT:     Head: Normocephalic and atraumatic.     Right Ear: Hearing, tympanic membrane and ear canal normal.     Left Ear: Hearing and ear canal normal. Tympanic membrane is erythematous. Tympanic membrane is not injected, scarred, perforated, retracted or bulging.     Mouth/Throat:     Lips: Pink.     Mouth: Mucous membranes are moist. No injury or oral lesions.     Pharynx: No pharyngeal swelling, posterior oropharyngeal erythema or postnasal drip.  Eyes:     General: Lids are normal. Gaze aligned appropriately.     Extraocular Movements:     Right eye: Nystagmus present.     Left eye: Nystagmus present.     Conjunctiva/sclera: Conjunctivae normal.     Pupils: Pupils are equal, round, and reactive to light.     Comments: Pt has mild nystagmus with upward gaze and gaze to the right   Cardiovascular:     Rate and Rhythm: Normal rate and regular rhythm.     Heart sounds: Normal heart sounds. No murmur heard.    No friction rub. No gallop.  Pulmonary:     Effort: Pulmonary effort is normal.     Breath sounds: Normal breath sounds. No decreased air movement. No decreased breath sounds, wheezing, rhonchi or rales.  Musculoskeletal:     Cervical back: Normal range of motion and neck supple. Normal range of motion.  Lymphadenopathy:     Head:     Right side of head: No submental or submandibular  adenopathy.     Left side of head: No submental or submandibular adenopathy.     Cervical:     Right cervical: No superficial cervical adenopathy.    Left cervical: No superficial cervical adenopathy.     Upper Body:     Right upper body: No supraclavicular adenopathy.     Left upper body: No supraclavicular adenopathy.  Skin:    General: Skin is warm and dry.  Neurological:     Mental Status: She  is alert and oriented to person, place, and time.     Cranial Nerves: No cranial nerve deficit, dysarthria or facial asymmetry.     Motor: No weakness, tremor, atrophy or abnormal muscle tone.  Psychiatric:        Attention and Perception: Attention and perception normal.        Mood and Affect: Mood and affect normal.        Speech: Speech normal.        Behavior: Behavior normal. Behavior is cooperative.      UC Treatments / Results  Labs (all labs ordered are listed, but only abnormal results are displayed) Labs Reviewed  GLUCOSE, POCT (MANUAL RESULT ENTRY) - Abnormal; Notable for the following components:      Result Value   POC Glucose 140 (*)    All other components within normal limits    EKG   Radiology No results found.  Procedures Procedures (including critical care time)  Medications Ordered in UC Medications  ondansetron  (ZOFRAN -ODT) disintegrating tablet 4 mg (4 mg Oral Given 06/13/24 0917)    Initial Impression / Assessment and Plan / UC Course  I have reviewed the triage vital signs and the nursing notes.  Pertinent labs & imaging results that were available during my care of the patient were reviewed by me and considered in my medical decision making (see chart for details).      Final Clinical Impressions(s) / UC Diagnoses   Final diagnoses:  Dizzinesses  Nausea and vomiting, unspecified vomiting type   Patient presents today with concerns for dizziness and associated nausea with vomiting.  She reports that when this first started earlier this morning she was also breaking out in a sweat and felt like her blood pressure was elevated.  BP was taken by her companion this morning and was found to be 138/90.  CBG in clinic was 140.  Physical exam demonstrates mildly erythematous right tympanic membrane but no bulging or retraction.  Patient does appear slightly pale and she does have nystagmus present.  Based on her symptoms as well as physical exam  findings I am most suspicious for potential vertigo.  Reviewed safety precautions to assist with symptom management.  Will send patient home with scopolamine  patches as well as Zofran  ODT to assist with her dizziness and nausea.  Recommend increasing her hydration efforts to help prevent dehydration and trying to eat small portions of bland foods so that she does not get low blood sugars.  Reviewed that if her symptoms are not improving or seem to be worsening over the next 1 to 2 weeks she should follow-up with PCP as she may need referral to vestibular rehab.  Patient voices agreement understanding with recommendations.  Follow-up as needed.    Discharge Instructions      There are many causes for dizziness but I believe yours may be due to vertigo. To help with this I recommend the following:  Make sure you are staying well hydrated throughout the day-  at least 70 oz of water per day Make sure you are eating regularly spaced meals and snacks- especially when you are working or exerting yourself as low blood sugars can lead to your dizziness and cold sweats If you find yourself getting dizzy again please do the following: Tell someone Lower yourself into a chair or the floor to prevent falls or injuries if you pass out Drink water and have a snack Have someone observe you for signs of stroke  Make slow movements or changes in position to reduce risk of further dizziness  I have sent in a medication called Scopolamine  patches for your to apply to your skin once every 3 days as needed to help with dizziness.   I have also sent in a medication called Zofran  to assist with your nausea and vomiting.  You can take this as needed up to every 8 hours.  Please be advised that the most common side effect of this medication is constipation.  If you start to develop the symptoms I recommend taking a few doses of a stool softener and increasing your fiber.  If needed you can also take a few doses of MiraLAX  until you have regular bowel movements again.  If you feel like your symptoms are worsening or seem to be persisting for the next 1 to 2 weeks I recommend following up with your primary care provider as you may need referral to vestibular rehab or ongoing medication management to assist with your symptoms        ED Prescriptions     Medication Sig Dispense Auth. Provider   scopolamine  (TRANSDERM-SCOP) 1 MG/3DAYS Place 1 patch (1.5 mg total) onto the skin every 3 (three) days. 4 patch Andrzej Scully E, PA-C   ondansetron  (ZOFRAN -ODT) 4 MG disintegrating tablet Take 1 tablet (4 mg total) by mouth every 8 (eight) hours as needed for nausea or vomiting. 20 tablet Ariyon Mittleman E, PA-C      PDMP not reviewed this encounter.   Gwin Eagon, Rocky BRAVO, PA-C 06/13/24 1013

## 2024-06-13 NOTE — Discharge Instructions (Addendum)
 There are many causes for dizziness but I believe yours may be due to vertigo. To help with this I recommend the following:  Make sure you are staying well hydrated throughout the day- at least 70 oz of water per day Make sure you are eating regularly spaced meals and snacks- especially when you are working or exerting yourself as low blood sugars can lead to your dizziness and cold sweats If you find yourself getting dizzy again please do the following: Tell someone Lower yourself into a chair or the floor to prevent falls or injuries if you pass out Drink water and have a snack Have someone observe you for signs of stroke  Make slow movements or changes in position to reduce risk of further dizziness  I have sent in a medication called Scopolamine  patches for your to apply to your skin once every 3 days as needed to help with dizziness.   I have also sent in a medication called Zofran  to assist with your nausea and vomiting.  You can take this as needed up to every 8 hours.  Please be advised that the most common side effect of this medication is constipation.  If you start to develop the symptoms I recommend taking a few doses of a stool softener and increasing your fiber.  If needed you can also take a few doses of MiraLAX until you have regular bowel movements again.  If you feel like your symptoms are worsening or seem to be persisting for the next 1 to 2 weeks I recommend following up with your primary care provider as you may need referral to vestibular rehab or ongoing medication management to assist with your symptoms

## 2024-06-13 NOTE — ED Notes (Signed)
 Pt is hunched over emesis basin in triage room.

## 2024-06-15 ENCOUNTER — Telehealth: Payer: Self-pay

## 2024-06-15 NOTE — Telephone Encounter (Signed)
 Work note placed in [employer] for patient as requested.  Return to work on 06/15/2024 without restriction.

## 2024-07-20 ENCOUNTER — Other Ambulatory Visit: Payer: Self-pay

## 2024-07-20 ENCOUNTER — Other Ambulatory Visit: Payer: Self-pay | Admitting: Allergy

## 2024-07-20 ENCOUNTER — Other Ambulatory Visit (HOSPITAL_COMMUNITY): Payer: Self-pay

## 2024-07-20 MED ORDER — IPRATROPIUM BROMIDE 0.06 % NA SOLN
2.0000 | Freq: Three times a day (TID) | NASAL | 5 refills | Status: DC
  Filled 2024-07-20 – 2024-07-22 (×2): qty 15, 25d supply, fill #0

## 2024-07-22 ENCOUNTER — Other Ambulatory Visit (HOSPITAL_COMMUNITY): Payer: Self-pay

## 2024-07-28 ENCOUNTER — Ambulatory Visit: Admitting: Allergy

## 2024-07-28 ENCOUNTER — Other Ambulatory Visit: Payer: Self-pay

## 2024-07-28 VITALS — BP 106/80 | HR 61 | Temp 98.1°F

## 2024-07-28 DIAGNOSIS — J4540 Moderate persistent asthma, uncomplicated: Secondary | ICD-10-CM

## 2024-07-28 DIAGNOSIS — J329 Chronic sinusitis, unspecified: Secondary | ICD-10-CM

## 2024-07-28 DIAGNOSIS — J3089 Other allergic rhinitis: Secondary | ICD-10-CM | POA: Diagnosis not present

## 2024-07-28 DIAGNOSIS — K2100 Gastro-esophageal reflux disease with esophagitis, without bleeding: Secondary | ICD-10-CM | POA: Diagnosis not present

## 2024-07-28 DIAGNOSIS — J302 Other seasonal allergic rhinitis: Secondary | ICD-10-CM

## 2024-07-28 MED ORDER — MONTELUKAST SODIUM 10 MG PO TABS
10.0000 mg | ORAL_TABLET | Freq: Every day | ORAL | 1 refills | Status: AC
  Filled 2024-07-28 – 2024-10-17 (×2): qty 90, 90d supply, fill #0

## 2024-07-28 MED ORDER — AZELASTINE HCL 137 MCG/SPRAY NA SOLN
2.0000 | Freq: Two times a day (BID) | NASAL | 5 refills | Status: AC | PRN
  Filled 2024-07-28: qty 30, 25d supply, fill #0
  Filled 2024-10-05: qty 30, 25d supply, fill #1
  Filled 2024-11-21: qty 90, 90d supply, fill #2

## 2024-07-28 MED ORDER — BUDESONIDE-FORMOTEROL FUMARATE 160-4.5 MCG/ACT IN AERO
2.0000 | INHALATION_SPRAY | Freq: Two times a day (BID) | RESPIRATORY_TRACT | 5 refills | Status: AC
  Filled 2024-07-28: qty 10.2, 30d supply, fill #0
  Filled 2024-10-05: qty 10.2, 30d supply, fill #1
  Filled 2024-11-21: qty 30.6, 90d supply, fill #2

## 2024-07-28 MED ORDER — IPRATROPIUM BROMIDE 0.06 % NA SOLN
2.0000 | Freq: Three times a day (TID) | NASAL | 5 refills | Status: AC
  Filled 2024-07-28 – 2024-10-05 (×2): qty 15, 25d supply, fill #0
  Filled 2024-10-28: qty 15, 25d supply, fill #1
  Filled 2024-11-21: qty 45, 90d supply, fill #2

## 2024-07-28 NOTE — Patient Instructions (Addendum)
 Recurrent sinusitis Doing well thus far this year without infection or antibiotic needs You had a great response to pneumovax with a 100% protective rate.   Will continue to keep track of infections moving forward  Asthma Continue montelukast  10 mg once a day to prevent cough or wheeze Continue Symbicort  160-we will try to decrease to 1 puff twice a day with a spacer to prevent cough or wheeze.  If symptoms return or increase in albuterol  use then go back up to 2 puffs twice a day Continue albuterol  2 puffs every 4 hours as needed for cough or wheeze OR Instead use albuterol  0.083% solution via nebulizer one unit vial every 4 hours as needed for cough or wheeze  Asthma control goals:  Full participation in all desired activities (may need albuterol  before activity) Albuterol  use two time or less a week on average (not counting use with activity) Cough interfering with sleep two time or less a month Oral steroids no more than once a year No hospitalizations  Allergic rhinitis Continue Allegra  once a day as needed for runny nose or itch. Remember to rotate to a different antihistamine about every 3-6 months between Allegra  and Xyzal .  Continue Nasonex  2 sprays in each nostril once a day as needed for 1-2 weeks at a time for maximum benefit for congestion control.  Continue Azelastine  2 sprays in each nostril in the morning and at night before bed for drainage control Continue Atrovent  (ipratropium) nasal spray 2 sprays in each nostril up to 3 times a day as needed for runny nose.  Advised to do a midday usage of the spray to help with the drainage. In the right nostril, point the applicator out toward the right ear. In the left nostril, point the applicator out toward the left ear Continue allergen avoidance measures directed toward grass pollen, weed pollen, mold, dust mite, cat, and cockroach  Reflux Continue Famotidine  20 mg twice a day to control reflux.   If famotidine  becomes  ineffective then would change for about a 6 to 8-week duration of a PPI like Nexium, Prilosec. continue dietary and lifestyle modifications as listed below   Follow up in 6 months or sooner if needed.

## 2024-07-28 NOTE — Progress Notes (Signed)
 Follow-up Note  RE: Regina Rice MRN: [id number] DOB: 11-08-1965 Date of Office Visit: 07/28/2024    History of present illness: Regina Rice is a 58 y.o. female presenting today for follow-up of recurrent sinusitis, asthma, allergic rhinitis and reflux.  She was last in the office on 01/14/2024 by myself. Discussed the use of AI scribe software for clinical note transcription with the patient, who gave verbal consent to proceed.  Her sinus symptoms have been manageable this year despite an unusual pollen season. She attributes some improvement to a neighborhood high school student mowing her lawn, reducing her exposure to allergens. No sinus infections have occurred since her last visit in March, and she has not required antibiotics.  She has not needed to use her rescue inhaler or nebulizer and continues to use Symbicort  at a dose of two puffs twice a day. She has received her flu vaccine for the season and has had at least two COVID vaccines, though she is unsure if she received a third.  She continues to take Allegra  for her allergies and uses azelastine  and Nasonex  nasal sprays. She also uses ibuprofen in the morning to manage drainage.  She experiences occasional reflux, which was more frequent about a month ago despite avoiding common triggers. She manages her symptoms with famotidine , which she finds effective.  No recent sinus infections, use of rescue inhaler, or nebulizer. Occasional reflux.     Review of systems: 10pt ROS negative unless noted above in HPI  Past medical/social/surgical/family history have been reviewed and are unchanged unless specifically indicated below.  No changes  Medication List: Current Outpatient Medications  Medication Sig Dispense Refill   albuterol  (PROVENTIL ) (2.5 MG/3ML) 0.083% nebulizer solution Inhale 1 vial (2.5 mg total) by nebulization every 4 (four) hours as needed for wheezing or shortness of breath. 150 mL 1   albuterol   (VENTOLIN  HFA) 108 (90 Base) MCG/ACT inhaler Inhale 2 puffs into the lungs every 4 (four) hours as needed for wheezing or shortness of breath. 6.7 g 1   aspirin 81 MG tablet Take 81 mg by mouth daily.     Azelastine  HCl 137 MCG/SPRAY SOLN Place 2 sprays into both nostrils 2 (two) times daily as needed. 30 mL 5   bisacodyl  (DULCOLAX) 5 MG EC tablet Take by mouth as directed 4 tablet 0   budesonide -formoterol  (SYMBICORT ) 160-4.5 MCG/ACT inhaler Inhale 2 puffs into the lungs 2 times daily. 10.2 g 4   Cholecalciferol (VITAMIN D3) 25 MCG (1000 UT) CAPS Take by mouth daily.     famotidine  (PEPCID ) 20 MG tablet Take 1 tablet (20 mg total) by mouth 2 (two) times daily. 60 tablet 5   fexofenadine  (ALLEGRA ) 180 MG tablet Take 1 tablet (180 mg total) by mouth daily. 30 tablet 5   Glucos-Chond-Hyal Ac-Ca Fructo (MOVE FREE JOINT HEALTH ADVANCE) TABS      glucosamine-chondroitin 500-400 MG tablet Take 1 tablet by mouth 2 (two) times daily.     ipratropium (ATROVENT ) 0.06 % nasal spray Place 2 sprays into both nostrils 3 (three) times daily. 15 mL 5   ipratropium-albuterol  (DUONEB) 0.5-2.5 (3) MG/3ML SOLN Take 3 mLs by nebulization every 4 (four) hours as needed (shorntess of breath, wheezing and chest tightness). 90 mL 0   lisinopril  (ZESTRIL ) 10 MG tablet Take 1 tablet (10 mg total) by mouth daily. 90 tablet 4   Melatonin Gummies 2.5 MG CHEW Chew 3 mg by mouth in the morning and at bedtime.  mometasone  (NASONEX ) 50 MCG/ACT nasal spray Place 2 sprays into the nose daily. 17 g 5   montelukast  (SINGULAIR ) 10 MG tablet Take 1 tablet (10 mg total) by mouth at bedtime. 90 tablet 1   Multiple Vitamin (MULTI-VITAMIN PO) Take by mouth.     Norgestimate -Ethinyl Estradiol Triphasic (TRI-VYLIBRA ) 0.18/0.215/0.25 MG-35 MCG tablet Take 1 tablet by mouth daily 84 tablet 3   ondansetron  (ZOFRAN -ODT) 4 MG disintegrating tablet Take 1 tablet (4 mg total) by mouth every 8 (eight) hours as needed for nausea or vomiting. 20  tablet 0   polyethylene glycol-electrolytes (NULYTELY) 420 g solution take as directed 4000 mL 0   rosuvastatin  (CRESTOR ) 10 MG tablet Take 1 tablet (10 mg total) by mouth daily. 90 tablet 4   scopolamine  (TRANSDERM-SCOP) 1 MG/3DAYS Place 1 patch (1.5 mg total) onto the skin (behind ear) every 3 days. 4 patch 0   TURMERIC PO Take by mouth daily.     Zinc 50 MG TABS 1 tablet Orally Once a day     ZINC SULFATE PO Take 50 mg by mouth daily.     benzonatate  (TESSALON ) 100 MG capsule Take 1 capsule (100 mg total) by mouth every 8 (eight) hours. (Patient not taking: Reported on 07/28/2024) 21 capsule 0   brompheniramine-pseudoephedrine-DM 30-2-10 MG/5ML syrup Take 5 mLs by mouth 4 (four) times daily as needed. (Patient not taking: Reported on 07/28/2024) 240 mL 0   levocetirizine (XYZAL ) 5 MG tablet Take 1 tablet (5 mg total) by mouth every evening. (Patient not taking: Reported on 07/28/2024) 30 tablet 5   promethazine -dextromethorphan (PROMETHAZINE -DM) 6.25-15 MG/5ML syrup Take 5 mLs by mouth 4 (four) times daily as needed for cough. (Patient not taking: Reported on 07/28/2024) 118 mL 0   No current facility-administered medications for this visit.     Known medication allergies: Allergies  Allergen Reactions   Amoxicillin Rash   Sulfamethoxazole-Trimethoprim Swelling     Physical examination: Blood pressure 106/80, pulse 61, temperature 98.1 F (36.7 C), SpO2 96%.  General: Alert, interactive, in no acute distress. HEENT: PERRLA, TMs pearly gray, turbinates non-edematous without discharge, post-pharynx non erythematous. Neck: Supple without lymphadenopathy. Lungs: Clear to auscultation without wheezing, rhonchi or rales. {no increased work of breathing. CV: Normal S1, S2 without murmurs. Abdomen: Nondistended, nontender. Skin: Warm and dry, without lesions or rashes. Extremities:  No clubbing, cyanosis or edema. Neuro:   Grossly intact.  Diagnostics/Labs: None today  Assessment and  plan: Recurrent sinusitis Doing well thus far this year without infection or antibiotic needs You had a great response to pneumovax with a 100% protective rate.   Will continue to keep track of infections moving forward  Asthma Continue montelukast  10 mg once a day to prevent cough or wheeze Continue Symbicort  160-we will try to decrease to 1 puff twice a day with a spacer to prevent cough or wheeze.  If symptoms return or increase in albuterol  use then go back up to 2 puffs twice a day Continue albuterol  2 puffs every 4 hours as needed for cough or wheeze OR Instead use albuterol  0.083% solution via nebulizer one unit vial every 4 hours as needed for cough or wheeze  Asthma control goals:  Full participation in all desired activities (may need albuterol  before activity) Albuterol  use two time or less a week on average (not counting use with activity) Cough interfering with sleep two time or less a month Oral steroids no more than once a year No hospitalizations  Allergic rhinitis Continue Allegra  once  a day as needed for runny nose or itch. Remember to rotate to a different antihistamine about every 3-6 months between Allegra  and Xyzal .  Continue Nasonex  2 sprays in each nostril once a day as needed for 1-2 weeks at a time for maximum benefit for congestion control.  Continue Azelastine  2 sprays in each nostril in the morning and at night before bed for drainage control Continue Atrovent  (ipratropium) nasal spray 2 sprays in each nostril up to 3 times a day as needed for runny nose.  Advised to do a midday usage of the spray to help with the drainage. In the right nostril, point the applicator out toward the right ear. In the left nostril, point the applicator out toward the left ear Continue allergen avoidance measures directed toward grass pollen, weed pollen, mold, dust mite, cat, and cockroach  Reflux Continue Famotidine  20 mg twice a day to control reflux.   If famotidine  becomes  ineffective then would change for about a 6 to 8-week duration of a PPI like Nexium, Prilosec. continue dietary and lifestyle modifications as listed below   Follow up in 6 months or sooner if needed.  I appreciate the opportunity to take part in Safiatou's care. Please do not hesitate to contact me with questions.  Sincerely,   Danita Brain, MD Allergy/Immunology [facility]

## 2024-07-29 ENCOUNTER — Other Ambulatory Visit (HOSPITAL_COMMUNITY): Payer: Self-pay

## 2024-07-29 ENCOUNTER — Other Ambulatory Visit: Payer: Self-pay

## 2024-08-01 ENCOUNTER — Other Ambulatory Visit (HOSPITAL_COMMUNITY): Payer: Self-pay

## 2024-08-02 ENCOUNTER — Other Ambulatory Visit (HOSPITAL_COMMUNITY): Payer: Self-pay

## 2024-08-02 ENCOUNTER — Encounter: Payer: Self-pay | Admitting: Allergy

## 2024-08-25 ENCOUNTER — Other Ambulatory Visit: Payer: Self-pay | Admitting: Nurse Practitioner

## 2024-08-25 DIAGNOSIS — Z1231 Encounter for screening mammogram for malignant neoplasm of breast: Secondary | ICD-10-CM

## 2024-10-06 ENCOUNTER — Other Ambulatory Visit: Payer: Self-pay

## 2024-10-06 ENCOUNTER — Other Ambulatory Visit (HOSPITAL_COMMUNITY): Payer: Self-pay

## 2024-10-17 ENCOUNTER — Other Ambulatory Visit: Payer: Self-pay

## 2024-10-19 DIAGNOSIS — Z01419 Encounter for gynecological examination (general) (routine) without abnormal findings: Secondary | ICD-10-CM | POA: Diagnosis not present

## 2024-10-19 DIAGNOSIS — N912 Amenorrhea, unspecified: Secondary | ICD-10-CM | POA: Diagnosis not present

## 2024-11-21 ENCOUNTER — Ambulatory Visit

## 2024-11-26 ENCOUNTER — Other Ambulatory Visit (HOSPITAL_COMMUNITY): Payer: Self-pay

## 2024-11-28 ENCOUNTER — Other Ambulatory Visit: Payer: Self-pay

## 2024-12-02 ENCOUNTER — Ambulatory Visit: Admission: RE | Admit: 2024-12-02 | Source: Ambulatory Visit

## 2024-12-02 DIAGNOSIS — Z1231 Encounter for screening mammogram for malignant neoplasm of breast: Secondary | ICD-10-CM

## 2025-01-26 ENCOUNTER — Ambulatory Visit: Admitting: Allergy
